# Patient Record
Sex: Female | Born: 1955 | Race: White | Hispanic: No | Marital: Married | Smoking: Never smoker
Health system: Southern US, Community
[De-identification: ages and names within clinical notes are randomized; demographics above are authoritative.]

## PROBLEM LIST (undated history)

## (undated) DIAGNOSIS — I10 Essential (primary) hypertension: Secondary | ICD-10-CM

## (undated) DIAGNOSIS — E119 Type 2 diabetes mellitus without complications: Secondary | ICD-10-CM

## (undated) DIAGNOSIS — I639 Cerebral infarction, unspecified: Secondary | ICD-10-CM

## (undated) HISTORY — PX: TONSILLECTOMY: SUR1361

---

## 2010-05-01 ENCOUNTER — Ambulatory Visit: Payer: Self-pay

## 2010-05-03 ENCOUNTER — Ambulatory Visit: Payer: Self-pay

## 2011-01-21 ENCOUNTER — Ambulatory Visit: Payer: Self-pay | Admitting: *Deleted

## 2011-01-21 ENCOUNTER — Inpatient Hospital Stay: Payer: Self-pay | Admitting: Internal Medicine

## 2011-02-17 ENCOUNTER — Encounter: Payer: Self-pay | Admitting: Family Medicine

## 2011-02-20 ENCOUNTER — Encounter: Payer: Self-pay | Admitting: Family Medicine

## 2011-03-18 ENCOUNTER — Ambulatory Visit: Payer: Self-pay | Admitting: Gastroenterology

## 2011-03-23 ENCOUNTER — Encounter: Payer: Self-pay | Admitting: Family Medicine

## 2011-04-22 ENCOUNTER — Encounter: Payer: Self-pay | Admitting: Family Medicine

## 2011-05-23 ENCOUNTER — Encounter: Payer: Self-pay | Admitting: Family Medicine

## 2011-05-27 ENCOUNTER — Other Ambulatory Visit: Payer: Self-pay

## 2011-06-22 ENCOUNTER — Encounter: Payer: Self-pay | Admitting: Family Medicine

## 2011-07-23 ENCOUNTER — Encounter: Payer: Self-pay | Admitting: Family Medicine

## 2011-08-23 ENCOUNTER — Encounter: Payer: Self-pay | Admitting: Family Medicine

## 2011-08-27 DIAGNOSIS — F09 Unspecified mental disorder due to known physiological condition: Secondary | ICD-10-CM

## 2011-08-27 DIAGNOSIS — I6340 Cerebral infarction due to embolism of unspecified cerebral artery: Secondary | ICD-10-CM

## 2011-09-04 DIAGNOSIS — I619 Nontraumatic intracerebral hemorrhage, unspecified: Secondary | ICD-10-CM

## 2011-09-04 DIAGNOSIS — F09 Unspecified mental disorder due to known physiological condition: Secondary | ICD-10-CM

## 2013-12-12 ENCOUNTER — Ambulatory Visit: Payer: Self-pay | Admitting: Family Medicine

## 2014-04-11 ENCOUNTER — Ambulatory Visit: Payer: Self-pay | Admitting: Gastroenterology

## 2014-04-11 LAB — PROTIME-INR
INR: 1
Prothrombin Time: 13.3 secs (ref 11.5–14.7)

## 2014-04-14 LAB — PATHOLOGY REPORT

## 2014-11-02 DIAGNOSIS — E119 Type 2 diabetes mellitus without complications: Secondary | ICD-10-CM

## 2014-12-13 ENCOUNTER — Ambulatory Visit: Payer: Self-pay | Admitting: Family Medicine

## 2015-01-01 ENCOUNTER — Ambulatory Visit: Payer: Self-pay | Admitting: Family Medicine

## 2015-05-17 ENCOUNTER — Other Ambulatory Visit: Payer: Self-pay | Admitting: Family Medicine

## 2015-05-17 DIAGNOSIS — R928 Other abnormal and inconclusive findings on diagnostic imaging of breast: Secondary | ICD-10-CM

## 2015-05-17 DIAGNOSIS — Z803 Family history of malignant neoplasm of breast: Secondary | ICD-10-CM

## 2015-07-09 ENCOUNTER — Ambulatory Visit
Admission: RE | Admit: 2015-07-09 | Discharge: 2015-07-09 | Disposition: A | Discharge status: Home / Self Care | Payer: PPO | Source: Ambulatory Visit | Attending: Family Medicine | Admitting: Family Medicine

## 2015-07-09 DIAGNOSIS — R928 Other abnormal and inconclusive findings on diagnostic imaging of breast: Secondary | ICD-10-CM

## 2015-07-09 DIAGNOSIS — Z803 Family history of malignant neoplasm of breast: Secondary | ICD-10-CM

## 2015-07-09 DIAGNOSIS — Z09 Encounter for follow-up examination after completed treatment for conditions other than malignant neoplasm: Secondary | ICD-10-CM | POA: Insufficient documentation

## 2015-07-11 ENCOUNTER — Ambulatory Visit: Payer: PPO | Attending: Neurology

## 2015-07-11 DIAGNOSIS — R29898 Other symptoms and signs involving the musculoskeletal system: Secondary | ICD-10-CM | POA: Diagnosis not present

## 2015-07-11 DIAGNOSIS — R2681 Unsteadiness on feet: Secondary | ICD-10-CM | POA: Insufficient documentation

## 2015-07-11 NOTE — Therapy (Deleted)
[place] [facility] MAIN [facility] SERVICES [address] [place], [place], [address] Phone: [phone]   Fax:  [phone]  Physical Therapy Treatment  Patient Details  Name: Sherri Jenkins MRN: [id number] Date of Birth: 08/09/1956 Referring Provider:  Morene Crocker, MD  Encounter Date: 07/11/2015      PT End of Session - 07/11/15 1225    Visit Number 1   Number of Visits 9   Date for PT Re-Evaluation 08/01/15   Authorization Type 1/10 G codes   PT Start Time 0920   PT Stop Time 0945   PT Time Calculation (min) 25 min   Equipment Utilized During Treatment Gait belt   Activity Tolerance Patient tolerated treatment well   Behavior During Therapy [facility] for tasks assessed/performed      History reviewed. No pertinent past medical history.  History reviewed. No pertinent past surgical history.  There were no vitals filed for this visit.  Visit Diagnosis:  Unsteadiness on feet  Weakness of both lower extremities      Subjective Assessment - 07/11/15 01-May-1207    Subjective pt reports she had a stroke in January 2012 and had regained most of her function prior to stroke.  pt relates that as of December of 2015 she has started experiencing increased fatigue, decreased strength, decreased balance and atributes it to her untrolled DM.  She was diagnosed with DM in December of  2015 and her MD  is working on helping control her glucose levels.  pt reports ~2 weeks ago she fell down when she was "sleep walking"/not fully awake and ended on the ground face down.  pt relates she did not sustain any injuries.  pt relates she uses her single point cane when she is in the community for safety and does not use it at home.  pt reports difficulty walking on uneven ground due to decreae in balance.  pt also relates she used to be able be to walk for ~ an hour and now can only walk ~ 20 minutes due to fatigue.     Patient Stated Goals increase speed/indurance/strength and  balance   Multiple Pain Sites No            [facility] PT Assessment - 07/11/15 0001    Assessment   Medical Diagnosis left hemiplegia   Onset Date/Surgical Date 01/05/11   Hand Dominance Right   Prior Therapy physical therapy   Precautions   Precautions Fall   Restrictions   Weight Bearing Restrictions No   Balance Screen   Has the patient fallen in the past 6 months Yes   How many times? 1   Has the patient had a decrease in activity level because of a fear of falling?  Yes   Is the patient reluctant to leave their home because of a fear of falling?  No   Home Environment   Living Environment Private residence   Living Arrangements Spouse/significant other   Available Help at Discharge Family   Type of Home House   Home Layout One level   Home Equipment [place] - single point;Grab bars - toilet;Tub bench   Prior Function   Level of Independence Independent with basic ADLs;Independent with household mobility without device;Independent with community mobility with device   Vocation On disability   Cognition   Overall Cognitive Status Within Functional Limits for tasks assessed   Memory Impaired  expresses difficulty   Memory Impairment --  expresses decreased retrieval  Awareness Appears intact   Sensation   Light Touch Appears Intact  decreased sensation on left side   Proprioception Not tested   Coordination   Gross Motor Movements are Fluid and Coordinated Yes   Finger Nose Finger Test intact   Posture/Postural Control   Posture/Postural Control No significant limitations      pt arrived ~30 minutes late  UE and LE dermatomes: intact but less on L LE/UE myotomes: WNL  LE MMT: Right hip flexion: 4/5 Left hip flexion: 4-/5 Right/left knee extension: 5/5 Right/left knee flexion: 5/5 Right/left ankle dorsiflexion: 4+/5 Right/left ankle eversion: 4+/5 Right left ankle inversion: 4+/5    Right/left knee jerK: 2 Ankle jerk: absent  5x  sit-to-stand:12.17 10 meter gait speed with single point cane: 1.85m/s 10 meter gait speed without cane: 1.39 m/s                         PT Education - 04-Aug-2015 1224    Education provided Yes   Education Details rational for outcome measures, plan of care   Person(s) Educated Patient   Methods Explanation   Comprehension Verbalized understanding             PT Long Term Goals - 04-Aug-2015 1502    PT LONG TERM GOAL #1   Title t will perfrom 5x sit-to-stand in less than or equal to 7.1 seconds in order to demonstrate average functional LE strength in her age group and decrease risk of falls   Baseline 12.17   Time 4   Period Weeks   Status New   PT LONG TERM GOAL #2   Title pt will report walking for ~ 30 minutes before resting in order to progress to prior level of fuctnion of 60 minutes   Baseline ambulates for ~20 min   Time 4   Period Weeks   Status New   PT LONG TERM GOAL #3   Title pt will ambulate with a gait speed of at least 1.20m/s on unveven ground in order to ambulate safely in the community    Baseline pt ambulates with gait speed of 1.39 on level ground    Period Weeks   Status New               Plan - 2015-08-04 1226    Clinical Impression Statement pt is a 59 year old female who had a CVA in January of 2012 whose balance, functional activity tolerance and strength have declined since december 2015.  pt presents with decreased functional LE strength, risk of falls, ampulates with decreased stance on left during gait and with minimal left arm swing.  pt would benefit from skilled PT servies to improve LE strength, decrease risk of falls, improve gait and functional activity tolerance to return to prior level of function    Pt will benefit from skilled therapeutic intervention in order to improve on the following deficits Decreased strength;Decreased balance;Decreased activity tolerance;Decreased endurance;Abnormal gait;Difficulty walking    Rehab Potential Good   PT Frequency 2x / week   PT Duration 4 weeks   PT Treatment/Interventions ADLs/Self Care Home Management;Aquatic Therapy;Functional mobility training;Patient/family education;Therapeutic activities;Therapeutic exercise;Balance training;Manual techniques;Neuromuscular re-education;Stair training;Gait training;Biofeedback;Electrical Stimulation   PT Next Visit Plan 6 min walk test, balance outcome measure          G-Codes - 04-Aug-2015 1514    Functional Assessment Tool Used outcome measures, history, clinical judgement   Functional Limitation Mobility: Walking and moving around  Mobility: Walking and Moving Around Current Status [phone](G8978) At least 1 percent but less than 20 percent impaired, limited or restricted  18%   Mobility: Walking and Moving Around Goal Status [phone](G8979) At least 1 percent but less than 20 percent impaired, limited or restricted  to less than 10%      Problem List There are no active problems to display for this patient.  Janus MolderJorge Theoren Palka, SPT Janus MolderJorge Vinton Layson 07/11/2015, 3:16 PM  [facility] MAIN [facility] [address] [place], [place], [address] Phone: [phone]   Fax:  [phone]

## 2015-07-11 NOTE — Therapy (Addendum)
[place] [facility] [address] [place], [place], [address] Phone: [phone]   Fax:  [phone]  Physical Therapy Evaluation  Patient Details  Name: Sherri Jenkins MRN: [id number] Date of Birth: 1956/08/14 Referring Provider:  Morene CrockerPotter, Zachary E, MD  Encounter Date: 07/11/2015      PT End of Session - 07/11/15 1225    Visit Number 1   Number of Visits 9   Date for PT Re-Evaluation 08/01/15   Authorization Type 1/10 G codes   PT Start Time 0920   PT Stop Time 0945   PT Time Calculation (min) 25 min   Equipment Utilized During Treatment Gait belt   Activity Tolerance Patient tolerated treatment well   Behavior [facility] Therapy [facility] for tasks assessed/performed      History reviewed. No pertinent past medical history.  History reviewed. No pertinent past surgical history.  There were no vitals filed for this visit.  Visit Diagnosis:  Unsteadiness on feet - Plan: PT plan of care cert/re-cert  Weakness of both lower extremities - Plan: PT plan of care cert/re-cert      Subjective Assessment - 07/11/15 1208    Subjective pt reports she had a stroke in January 2012 and had regained most of her function prior to stroke.  pt relates that as of December of 2015 she has started experiencing increased fatigue, decreased strength, decreased balance and atributes it to her untrolled DM.  She was diagnosed with DM in December of  2015 and her MD  is working on helping control her glucose levels.  pt reports ~2 weeks ago she fell down when she was "sleep walking"/not fully awake and ended on the ground face down.  pt relates she did not sustain any injuries.  pt relates she uses her single point cane when she is in the community for safety and does not use it at home.  pt reports difficulty walking on uneven ground due to decreae in balance.  pt also relates she used to be able be to walk for ~ an hour and now can only walk ~ 20 minutes due to  fatigue.     Patient Stated Goals increase speed/indurance/strength and balance   Multiple Pain Sites No            [facility] PT Assessment - 07/11/15 1200    Assessment   Medical Diagnosis left hemiplegia   Onset Date/Surgical Date 01/05/11   Hand Dominance Right   Prior Therapy physical therapy   Precautions   Precautions Fall   Restrictions   Weight Bearing Restrictions No   Home Environment   Living Environment Private residence   Living Arrangements Spouse/significant other   Available Help at Discharge Family   Type of Home House   Home Layout One level   Home Equipment [place] - single point;Grab bars - toilet;Tub bench   Prior Function   Level of Independence Independent with basic ADLs;Independent with household mobility without device;Independent with community mobility with device   Vocation On disability   Cognition   Overall Cognitive Status Within Functional Limits for tasks assessed   Memory Impaired  expresses difficulty   Memory Impairment --  expresses decreased retrieval    Awareness Appears intact   Sensation   Light Touch Appears Intact  decreased sensation on left side   Proprioception Not tested   Coordination   Gross Motor Movements are Fluid and Coordinated Yes   Finger Nose Finger Test intact  Posture/Postural Control   Posture/Postural Control No significant limitations   Ambulation/Gait   Gait velocity 1.69m/s      pt arrived ~30 minutes late  UE and LE dermatomes: intact but less on L LE/UE myotomes: WNL  LE MMT: Right hip flexion: 4/5 Left hip flexion: 4-/5 Right/left knee extension: 5/5 Right/left knee flexion: 5/5 Right/left ankle dorsiflexion: 4+/5 Right/left ankle eversion: 4+/5 Right left ankle inversion: 4+/5    Right/left knee jerK: 2 Ankle jerk: absent  5x sit-to-stand:12.17 10 meter gait speed with single point cane: 1.74m/s 10 meter gait speed without cane: 1.39 m/s  Posture:  Patient sits and ambulates with  left arm held close to her torso with elbow bent at 90 degrees Pt ambulates with decreased left arm swing, decreased stance on left and short stride length                 PT Education - 07/11/15 1224    Education provided Yes   Education Details rational for outcome measures, plan of care   Person(s) Educated Patient   Methods Explanation   Comprehension Verbalized understanding             PT Long Term Goals - 07/11/15 1502    PT LONG TERM GOAL #1   Title t will perfrom 5x sit-to-stand in less than or equal to 7.1 seconds in order to demonstrate average functional LE strength in her age group and decrease risk of falls   Baseline 12.17   Time 4   Period Weeks   Status New   PT LONG TERM GOAL #2   Title pt will report walking for ~ 30 minutes before resting in order to progress to prior level of fuctnion of 60 minutes   Baseline ambulates for ~20 min   Time 4   Period Weeks   Status New   PT LONG TERM GOAL #3   Title pt will ambulate with a gait speed of at least 1.63m/s on unveven ground in order to ambulate safely in the community    Baseline pt ambulates with gait speed of 1.39 on level ground    Period Weeks   Status New               Plan - 07/11/15 1226    Clinical Impression Statement pt is a 59 year old female who had a CVA in January of 2012 whose balance, functional activity tolerance and strength have declined since december 2015.  pt presents with decreased functional LE strength, risk of falls, ampulates with decreased stance on left during gait and with minimal left arm swing.  pt would benefit from skilled PT servies to improve LE strength, decrease risk of falls, improve gait and functional activity tolerance to return to prior level of function    Pt will benefit from skilled therapeutic intervention in order to improve on the following deficits Decreased strength;Decreased balance;Decreased activity tolerance;Decreased endurance;Abnormal  gait;Difficulty walking   Rehab Potential Good   PT Frequency 2x / week   PT Duration 4 weeks   PT Treatment/Interventions ADLs/Self Care Home Management;Aquatic Therapy;Functional mobility training;Patient/family education;Therapeutic activities;Therapeutic exercise;Balance training;Manual techniques;Neuromuscular re-education;Stair training;Gait training;Biofeedback;Electrical Stimulation   PT Next Visit Plan 6 min walk test, balance outcome measure          G-Codes - 2015-07-30 0948    Functional Assessment Tool Used outcome measures, history, clinical judgement   Functional Limitation Mobility: Walking and moving around   Mobility: Walking and Moving Around Current Status [phone])  At least 20 percent but less than 40 percent impaired, limited or restricted   Mobility: Walking and Moving Around Goal Status [phone]) At least 1 percent but less than 20 percent impaired, limited or restricted       Problem List There are no active problems to display for this patient.  Janus Molder, SPT This entire session was performed under direct supervision and direction of a licensed Estate agent . I have personally read, edited and approve of the note as written. Carlyon Shadow. Tortorici, PT, DPT [phone]  Tortorici,Ashley 07/12/2015, 9:55 AM  [place] [facility] MAIN [facility] SERVICES [address] [place], [place], [address] Phone: [phone]   Fax:  [phone]

## 2015-07-16 ENCOUNTER — Ambulatory Visit: Payer: PPO

## 2015-07-18 ENCOUNTER — Ambulatory Visit: Payer: PPO

## 2015-07-19 ENCOUNTER — Ambulatory Visit: Payer: PPO

## 2015-07-19 VITALS — BP 145/64

## 2015-07-19 DIAGNOSIS — R29898 Other symptoms and signs involving the musculoskeletal system: Secondary | ICD-10-CM

## 2015-07-19 DIAGNOSIS — R2681 Unsteadiness on feet: Secondary | ICD-10-CM

## 2015-07-19 NOTE — Therapy (Signed)
[place] [facility] MAIN [facility] SERVICES [address] [place], [place], [address] Phone: [phone]   Fax:  [phone]  Physical Therapy Treatment  Patient Details  Name: Sherri Jenkins MRN: [id number] Date of Birth: August 03, 1956 Referring Provider:  Morene Crocker, MD  Encounter Date: 07/19/2015      PT End of Session - 07/19/15 1733    Visit Number 2   Number of Visits 9   Date for PT Re-Evaluation 08/01/15   Authorization Type 2/10 G code   PT Start Time 1445   PT Stop Time 1530   PT Time Calculation (min) 45 min   Equipment Utilized During Treatment Gait belt   Activity Tolerance Patient tolerated treatment well   Behavior During Therapy [facility] for tasks assessed/performed      History reviewed. No pertinent past medical history.  History reviewed. No pertinent past surgical history.  Filed Vitals:   07/19/15 1728  BP: 145/64    Visit Diagnosis:  Unsteadiness on feet  Weakness of both lower extremities      Subjective Assessment - 07/19/15 1728    Subjective pt reports she has been walking to build up her endurance. reports no pain    Patient Stated Goals increase speed/indurance/strength and balance    Therex: DGI: 17/24 walk test: 1189ft BP assessed after walk   NMR:  NBOS: EO/EC 30 s x 3 Semi tandem vert/ horiz head turns x 5 - each leg Wide BOS with eyes closed and head turns 2x10 Pt required SBA for safety                              PT Education - 07/19/15 1731    Education provided Yes   Education Details balance education    Person(s) Educated Patient   Methods Explanation   Comprehension Verbalized understanding             PT Long Term Goals - 07/11/15 1502    PT LONG TERM GOAL #1   Title t will perfrom 5x sit-to-stand in less than or equal to 7.1 seconds in order to demonstrate average functional LE strength in her age group and decrease risk of falls   Baseline  12.17   Time 4   Period Weeks   Status New   PT LONG TERM GOAL #2   Title pt will report walking for ~ 30 minutes before resting in order to progress to prior level of fuctnion of 60 minutes   Baseline ambulates for ~20 min   Time 4   Period Weeks   Status New   PT LONG TERM GOAL #3   Title pt will ambulate with a gait speed of at least 1.29m/s on unveven ground in order to ambulate safely in the community    Baseline pt ambulates with gait speed of 1.39 on level ground    Period Weeks   Status New               Plan - 07/19/15 1733    Clinical Impression Statement pt did well on 6 min walk- and is almost within age norms walking 1115 ft today. pt did show moderate fall risk on DGI scoring 17/24. PT added balance exercises to HEP focusing on vestibular system. pt showed good understanding of this. Pt would benefit from continued skilled PT services to address dynamic balance and endurance to maximize community mobility.  Pt will benefit from skilled therapeutic intervention in order to improve on the following deficits Decreased strength;Decreased balance;Decreased activity tolerance;Decreased endurance;Abnormal gait;Difficulty walking   Rehab Potential Good   PT Frequency 2x / week   PT Duration 4 weeks   PT Treatment/Interventions ADLs/Self Care Home Management;Aquatic Therapy;Functional mobility training;Patient/family education;Therapeutic activities;Therapeutic exercise;Balance training;Manual techniques;Neuromuscular re-education;Stair training;Gait training;Biofeedback;Electrical Stimulation        Problem List There are no active problems to display for this patient.  Carlyon Shadow. Sheddrick Lattanzio, PT, DPT [phone]  Jamisen Duerson 07/19/2015, 5:37 PM  [place] [facility] MAIN [facility] SERVICES [address] [place], [place], [address] Phone: [phone]   Fax:  [phone]

## 2015-07-19 NOTE — Patient Instructions (Signed)
[email or web address] NBOS - eyes open / closed 30s x 3 Semi tandem with head turns vert/horiz 2x5

## 2015-07-23 ENCOUNTER — Ambulatory Visit: Payer: PPO | Attending: Neurology

## 2015-07-23 DIAGNOSIS — R29898 Other symptoms and signs involving the musculoskeletal system: Secondary | ICD-10-CM

## 2015-07-23 DIAGNOSIS — R2681 Unsteadiness on feet: Secondary | ICD-10-CM | POA: Insufficient documentation

## 2015-07-23 NOTE — Therapy (Signed)
[place] [facility] MAIN [facility] SERVICES [address] [place], [place], [address] Phone: [phone]   Fax:  [phone]  Physical Therapy Treatment  Patient Details  Name: Sherri Jenkins MRN: [id number] Date of Birth: 25-Oct-1956 Referring Provider:  Morene Crocker, MD  Encounter Date: 07/23/2015      PT End of Session - 07/23/15 1323    Visit Number 3   Number of Visits 9   Date for PT Re-Evaluation 08/01/15   Authorization Type 3/10 G code   PT Start Time 0919   PT Stop Time 1002   PT Time Calculation (min) 43 min   Equipment Utilized During Treatment Gait belt   Activity Tolerance Patient tolerated treatment well   Behavior During Therapy [facility] for tasks assessed/performed      History reviewed. No pertinent past medical history.  History reviewed. No pertinent past surgical history.  There were no vitals filed for this visit.  Visit Diagnosis:  Unsteadiness on feet  Weakness of both lower extremities      Subjective Assessment - 07/23/15 1320    Subjective pt reports she is doing well and left leg is slowly improveing. pt reports no pain    Patient Stated Goals increase speed/indurance/strength and balance   Currently in Pain? No/denies        Therex: Sit to stand x 10 Hip flexion/abduction/extension with red band x10 each leg  Step up/down on 4 inch step x10 each leg Side step up/down on 4 inch step x10 each leg Pt demonstrates decreased time on L LE during single leg stance phase of each exercise Pt required verbal cueing for proper technique  NMR:  Side stepping on airex matt x4 each way  Forward stepping on airex matt x4 each way Marching 4x65ft Forward walking with head turns 2x40 ft Forward walking with head turns and UE reach 2x40 ft Forward step and back x10 each LE  Side step and back x4 each LE Pt required verbal cueing for correct technique and focus on clearing L foot during activities.  Pt required SBA-min A  throughout session for safety (min A during one LOB) Pt required verbal cueing on increasing left arm sing during ambulation                            PT Education - 07/23/15 1322    Education provided Yes   Education Details improving left LE to improve single leg stance on L LE and confidence   Person(s) Educated Patient   Methods Explanation   Comprehension Verbalized understanding             PT Long Term Goals - 07/11/15 1502    PT LONG TERM GOAL #1   Title t will perfrom 5x sit-to-stand in less than or equal to 7.1 seconds in order to demonstrate average functional LE strength in her age group and decrease risk of falls   Baseline 12.17   Time 4   Period Weeks   Status New   PT LONG TERM GOAL #2   Title pt will report walking for ~ 30 minutes before resting in order to progress to prior level of fuctnion of 60 minutes   Baseline ambulates for ~20 min   Time 4   Period Weeks   Status New   PT LONG TERM GOAL #3   Title pt will ambulate with a gait speed of at least 1.32m/s on unveven  ground in order to ambulate safely in the community    Baseline pt ambulates with gait speed of 1.39 on level ground    Period Weeks   Status New               Plan - 07/23/15 1324    Clinical Impression Statement pt was able to complete session with 1 rest break and no increase in pain.  pt experienced 1 LOB while performing forward marching that required min A to recover.  pt demonstrates decreased stability during sinlge leg stance on left during session.  pt is able perform dual tasks while ambulating forward with no LOB. pt will benefit from skilled PT services to improve deficits    Pt will benefit from skilled therapeutic intervention in order to improve on the following deficits Decreased strength;Decreased balance;Decreased activity tolerance;Decreased endurance;Abnormal gait;Difficulty walking   Rehab Potential Good   PT Frequency 2x / week   PT  Duration 4 weeks   PT Treatment/Interventions ADLs/Self Care Home Management;Aquatic Therapy;Functional mobility training;Patient/family education;Therapeutic activities;Therapeutic exercise;Balance training;Manual techniques;Neuromuscular re-education;Stair training;Gait training;Biofeedback;Electrical Stimulation        Problem List There are no active problems to display for this patient.  Janus Molder, SPT This entire session was performed under direct supervision and direction of a licensed Estate agent . I have personally read, edited and approve of the note as written. Carlyon Shadow. Tortorici, PT, DPT [phone]  Tortorici,Ashley 07/23/2015, 2:17 PM  [place] [facility] MAIN [facility] SERVICES [address] [place], [place], [address] Phone: [phone]   Fax:  [phone]

## 2015-07-25 ENCOUNTER — Ambulatory Visit: Payer: PPO

## 2015-07-25 DIAGNOSIS — R2681 Unsteadiness on feet: Secondary | ICD-10-CM

## 2015-07-25 DIAGNOSIS — R29898 Other symptoms and signs involving the musculoskeletal system: Secondary | ICD-10-CM

## 2015-07-25 NOTE — Therapy (Signed)
[facility] [facility] MAIN [facility] SERVICES [address] [place], [place], [address] Phone: [phone]   Fax:  [phone]  Physical Therapy Treatment  Patient Details  Name: Sherri Jenkins MRN: [id number] Date of Birth: 11-17-1956 Referring Provider:  Morene Crocker, MD  Encounter Date: 07/25/2015      PT End of Session - 07/25/15 1805    Visit Number 4   Number of Visits 9   Date for PT Re-Evaluation 08/01/15   Authorization Type 4/10 G code   PT Start Time 0948   PT Stop Time 1015   PT Time Calculation (min) 27 min   Equipment Utilized During Treatment Gait belt   Activity Tolerance Patient tolerated treatment well   Behavior During Therapy [facility] for tasks assessed/performed      History reviewed. No pertinent past medical history.  History reviewed. No pertinent past surgical history.  There were no vitals filed for this visit.  Visit Diagnosis:  Unsteadiness on feet  Weakness of both lower extremities      Subjective Assessment - 07/25/15 1802    Subjective pt denies any pain this morning and reports her hips were sore after the last session.     Patient Stated Goals increase speed/indurance/strength and balance      Pt arrived 15 minutes late    NMR:  Side stepping on airex matt x4 laps Forward stepping on airex matt x4 laps Marching x 4 laps on //bars Obstacle course: side step over airex mat, weave between 3 cones followed by step up/down 4 inch step x 2 laps Obstacle course: side step over airex mat, diagonally move between 3 cones and then step up/down  Inch step x 2 laps, pt experienced greater difficulty with this versus above Stepping forward and back bilaterally with emphasis on engaging left arm x 4 min  Pt required verbal cueing for correct technique and focus on clearing L foot during activities. Pt required SBA for safety  Pt required verbal cueing on increasing left arm sing and or use during  activites                           PT Education - 07/25/15 1804    Education provided Yes   Education Details to focus on increasing left arm swing during gait to improve balance and normal gait.    Person(s) Educated Patient   Methods Explanation   Comprehension Verbalized understanding             PT Long Term Goals - 07/11/15 1502    PT LONG TERM GOAL #1   Title t will perfrom 5x sit-to-stand in less than or equal to 7.1 seconds in order to demonstrate average functional LE strength in her age group and decrease risk of falls   Baseline 12.17   Time 4   Period Weeks   Status New   PT LONG TERM GOAL #2   Title pt will report walking for ~ 30 minutes before resting in order to progress to prior level of fuctnion of 60 minutes   Baseline ambulates for ~20 min   Time 4   Period Weeks   Status New   PT LONG TERM GOAL #3   Title pt will ambulate with a gait speed of at least 1.19m/s on unveven ground in order to ambulate safely in the community    Baseline pt ambulates with gait speed of 1.39 on level ground  Period Weeks   Status New               Plan - 07/25/15 1806    Clinical Impression Statement pt was able to complete session without loss of balance.  pt demonstates little L UE arm swing or use during activites and requires verbal cueing to engage L UE.  Continues to tolerate PT sessions well and is highly motivated.    Pt will benefit from skilled therapeutic intervention in order to improve on the following deficits Decreased strength;Decreased balance;Decreased activity tolerance;Decreased endurance;Abnormal gait;Difficulty walking   Rehab Potential Good   PT Frequency 2x / week   PT Duration 4 weeks   PT Treatment/Interventions ADLs/Self Care Home Management;Aquatic Therapy;Functional mobility training;Patient/family education;Therapeutic activities;Therapeutic exercise;Balance training;Manual techniques;Neuromuscular  re-education;Stair training;Gait training;Biofeedback;Electrical Stimulation   PT Next Visit Plan 6 min walk test followed by neuro-ed activites        Problem List There are no active problems to display for this patient.  Janus Molder, SPT This entire session was performed under direct supervision and direction of a licensed Estate agent . I have personally read, edited and approve of the note as written.     Carlyon Shadow. Tortorici, PT, DPT  [phone]   Tortorici,Ashley 07/26/2015, 10:21 AM  [place] MAIN [facility] SERVICES [address] [place], [place], [address] Phone: [phone]   Fax:  [phone]

## 2015-07-30 ENCOUNTER — Ambulatory Visit: Payer: PPO

## 2015-07-30 DIAGNOSIS — R29898 Other symptoms and signs involving the musculoskeletal system: Secondary | ICD-10-CM

## 2015-07-30 DIAGNOSIS — R2681 Unsteadiness on feet: Secondary | ICD-10-CM | POA: Diagnosis not present

## 2015-07-30 NOTE — Therapy (Signed)
[place] [facility] MAIN [facility] SERVICES [address] [place], [place], [address] Phone: [phone]   Fax:  [phone]  Physical Therapy Treatment  Patient Details  Name: Sherri Jenkins MRN: [id number] Date of Birth: [birth year] Referring Provider:  Morene Crocker, MD  Encounter Date: 07/30/2015      PT End of Session - 07/30/15 1849    Visit Number 5   Number of Visits 9   Date for PT Re-Evaluation 08/01/15   Authorization Type 5/10 G code   PT Start Time 1441   PT Stop Time 1515   PT Time Calculation (min) 34 min   Equipment Utilized During Treatment Gait belt   Activity Tolerance Patient tolerated treatment well   Behavior During Therapy [facility] for tasks assessed/performed      History reviewed. No pertinent past medical history.  History reviewed. No pertinent past surgical history.  There were no vitals filed for this visit.  Visit Diagnosis:  Unsteadiness on feet  Weakness of both lower extremities      Subjective Assessment - 07/30/15 1848    Subjective pt relates she does not feel great and that her blood sugar has been running slow from this morning, around 80 and it was also low on Saturday but otherwise is doing well    Patient Stated Goals increase speed/indurance/strength and balance   Currently in Pain? No/denies   Multiple Pain Sites No         Pt arrived 10 minutes late   Therex:  Nustep: x3 min (no charge) Bilateral hip flexion/abduction/extension with blue band x10 each  NMR:  Forward stepping on airex mat x4 laps Stepping forward and back bilaterally with emphasis on engaging left arm 2x10 Side step and back 2x10 each LE Sit to stand with R LE in front x10, pt demonstrated decreased stability and required SBA for safety Pt ambulated 100 ft while performing dual tasking (looking up, down, left, right, turning left, turning right and stopping)   Pt required verbal cueing for correct technique and focus on  clearing L foot during activities. Pt required SBA for safety  Pt required verbal cueing on increasing left arm sing and or use during activites  blood glucose measured by nursing (heart track): 140 mmol/L - prior to exercise (was within treatable limits)                            PT Long Term Goals - 07/11/15 1502    PT LONG TERM GOAL #1   Title t will perfrom 5x sit-to-stand in less than or equal to 7.1 seconds in order to demonstrate average functional LE strength in her age group and decrease risk of falls   Baseline 12.17   Time 4   Period Weeks   Status New   PT LONG TERM GOAL #2   Title pt will report walking for ~ 30 minutes before resting in order to progress to prior level of fuctnion of 60 minutes   Baseline ambulates for ~20 min   Time 4   Period Weeks   Status New   PT LONG TERM GOAL #3   Title pt will ambulate with a gait speed of at least 1.48m/s on unveven ground in order to ambulate safely in the community    Baseline pt ambulates with gait speed of 1.39 on level ground    Period Weeks   Status New  Plan - 07/30/15 1850    Clinical Impression Statement pt's blood glucose level was measured due to running low prior to lunch (80) and was 140 during session.  progressed neuro re-ed exercises today and pt was able to complete them no complain of pain nor did she require any rest breaks.     Pt will benefit from skilled therapeutic intervention in order to improve on the following deficits Decreased strength;Decreased balance;Decreased activity tolerance;Decreased endurance;Abnormal gait;Difficulty walking   Rehab Potential Good   PT Frequency 2x / week   PT Duration 4 weeks   PT Treatment/Interventions ADLs/Self Care Home Management;Aquatic Therapy;Functional mobility training;Patient/family education;Therapeutic activities;Therapeutic exercise;Balance training;Manual techniques;Neuromuscular re-education;Stair training;Gait  training;Biofeedback;Electrical Stimulation   PT Next Visit Plan 6 min walk test followed by neuro-ed activites        Problem List There are no active problems to display for this patient.  Donald Pore Makari Portman,SPT This entire session was performed under direct supervision and direction of a licensed Estate agent . I have personally read, edited and approve of the note as written.  Carlyon Shadow. Tortorici, PT, DPT [phone]  Tortorici,Ashley 07/31/2015, 8:21 AM  [place] [facility] MAIN [facility] SERVICES [address] [place], [place], [address] Phone: [phone]   Fax:  [phone]

## 2015-08-01 ENCOUNTER — Ambulatory Visit: Payer: PPO

## 2015-08-01 DIAGNOSIS — R2681 Unsteadiness on feet: Secondary | ICD-10-CM

## 2015-08-01 DIAGNOSIS — R29898 Other symptoms and signs involving the musculoskeletal system: Secondary | ICD-10-CM

## 2015-08-01 NOTE — Therapy (Signed)
[place] [facility] MAIN [facility] SERVICES [address] [place], [place], [address] Phone: [phone]   Fax:  [phone]  Physical Therapy Treatment  Patient Details  Name: BEADIE MATSUNAGA MRN: [id number] Date of Birth: Feb 11, 1956 Referring Provider:  Morene Crocker, MD  Encounter Date: 08/01/2015      PT End of Session - 08/01/15 1841    Visit Number 6   Number of Visits 9   Date for PT Re-Evaluation 08/08/15   Authorization Type 6/10 G code   PT Start Time 1431   PT Stop Time 1515   PT Time Calculation (min) 44 min   Equipment Utilized During Treatment Gait belt   Activity Tolerance Patient tolerated treatment well   Behavior During Therapy [facility] for tasks assessed/performed      History reviewed. No pertinent past medical history.  History reviewed. No pertinent past surgical history.  There were no vitals filed for this visit.  Visit Diagnosis:  Unsteadiness on feet  Weakness of both lower extremities      Subjective Assessment - 08/01/15 1436    Subjective pt reports she feels better today compared to her last session and relates her blood sugar is doing well today.  pt relates she continues to use cane outside her home for safety.     Patient Stated Goals increase speed/indurance/strength and balance   Currently in Pain? No/denies      thera act: Forward stepping on red mat x4 laps Side stepping on red mat x 4 laps Stepping forward and back bilaterally with emphasis on engaging left arm 2x10 Side step and back 2x10 each LE Stepping forward and back bilaterally on red mat x10  Side step and back on red mat x10 each  Backwards walking on red matt x 4 laps Pt ambulated 100 ft while performing dual tasking (looking up, down, left, right, turning left, turning right and stopping)  Pt required verbal cueing for correct technique and focus on clearing L foot during activities. Pt required SBA for safety  Pt required verbal cueing  on increasing left arm sing and or use during activites  Therex: Recumbent bike x 3 min no charge Sit to stand x10 Leg press with 130# 2x10 Side lung on red mat x 10 each LE                                                PT Long Term Goals - 08/01/15 1848    PT LONG TERM GOAL #1   Title t will perfrom 5x sit-to-stand in less than or equal to 7.1 seconds in order to demonstrate average functional LE strength in her age group and decrease risk of falls   Baseline 12.17   Time 2   Period Weeks   Status On-going   PT LONG TERM GOAL #2   Title pt will report walking for ~ 30 minutes before resting in order to progress to prior level of fuctnion of 60 minutes   Baseline ambulates for ~20 min   Time 2   Status On-going   PT LONG TERM GOAL #3   Title pt will ambulate with a gait speed of at least 1.2m/s on unveven ground in order to ambulate safely in the community    Baseline pt ambulates with gait speed of 1.39 on level ground  Time 2   Status On-going               Plan - 08-30-15 1846    Clinical Impression Statement pt is progressing well towards goals and was able to complete session and demonstrates improved functional activity tolerance by not requring rest breaks.  pt is ambulating with improved left foot clearnce.  pt would benefit from continued skilled PT servies to make further gains, decrease risk of falls.     Pt will benefit from skilled therapeutic intervention in order to improve on the following deficits Decreased strength;Decreased balance;Decreased activity tolerance;Decreased endurance;Abnormal gait;Difficulty walking   Rehab Potential Good   PT Frequency 2x / week   PT Duration 4 weeks   PT Treatment/Interventions ADLs/Self Care Home Management;Aquatic Therapy;Functional mobility training;Patient/family education;Therapeutic activities;Therapeutic exercise;Balance training;Manual techniques;Neuromuscular  re-education;Stair training;Gait training;Biofeedback;Electrical Stimulation   PT Next Visit Plan outcome measures           G-Codes - 30-Aug-2015 1852    Functional Assessment Tool Used --   Functional Limitation --   Mobility: Walking and Moving Around Current Status (Z6109) --   Mobility: Walking and Moving Around Goal Status (U0454) --      Problem List There are no active problems to display for this patient.  Janus Molder, SPT This entire session was performed under direct supervision and direction of a licensed Estate agent . I have personally read, edited and approve of the note as written. Carlyon Shadow. Tortorici, PT, DPT [phone]  Tortorici,Ashley 08/02/2015, 1:10 PM  [place] [facility] MAIN [facility] SERVICES [address] [place], [place], [address] Phone: [phone]   Fax:  [phone]

## 2015-08-06 ENCOUNTER — Ambulatory Visit: Payer: PPO

## 2015-08-06 DIAGNOSIS — R29898 Other symptoms and signs involving the musculoskeletal system: Secondary | ICD-10-CM

## 2015-08-06 DIAGNOSIS — R2681 Unsteadiness on feet: Secondary | ICD-10-CM | POA: Diagnosis not present

## 2015-08-06 NOTE — Therapy (Addendum)
[place] [facility] MAIN [facility] SERVICES [address] [place], [place], [address] Phone: [phone]   Fax:  [phone]  Physical Therapy Treatment  Patient Details  Name: Sherri Jenkins MRN: [id number] Date of Birth: 02-Feb-1956 Referring Provider:  Morene Crocker, MD  Encounter Date: 08/06/2015      PT End of Session - 08/06/15 1438    Visit Number 7   Number of Visits 9   Date for PT Re-Evaluation 08/08/15   Authorization Type 7/10 G code   PT Start Time 1431   PT Stop Time 1515   PT Time Calculation (min) 44 min   Equipment Utilized During Treatment Gait belt   Activity Tolerance Patient tolerated treatment well   Behavior During Therapy [facility] for tasks assessed/performed      History reviewed. No pertinent past medical history.  History reviewed. No pertinent past surgical history.  There were no vitals filed for this visit.  Visit Diagnosis:  Unsteadiness on feet  Weakness of both lower extremities      Subjective Assessment - 08/06/15 1436    Subjective pt reports she had a good weekend and is happy because she lost 1#.  pt relates her blood sugar was "behaving" this weekend.  pt reports she feels "okay" currently.     Patient Stated Goals increase speed/indurance/strength and balance   Currently in Pain? No/denies   Multiple Pain Sites No        NMR  Forward stepping on red mat with objects underneath x2 laps Side stepping on red mat with objects underneathx 2 laps Stepping forward and back bilaterally with emphasis on engaging left arm 1x10 Side step and back 1x10 each LE Tapping on forward, cross midline and and side step x10 bilaterally  Backwards walking on red matt with objects underneath x 2 laps Forward stepping on red mat with objects underneath x2 laps while holding yellow ball  Side stepping on red mat with objects underneathx 2 lap while holding yellow ball Backwards walking on red matt with objects underneath x2  laps while holding yellow ball Pt ambulated 100 ft while performing dual tasking (looking up, down, left, right, turning left, turning right and stopping)  Pt required verbal cueing for correct technique and focus on clearing L foot during activities. Pt required SBA for safety  Pt required verbal cueing on increasing left arm sing and or use during activites  Therex: Recumbent bike x 4 min level 6 no charge Sit to stand 2x10 Leg press with 130# 3x10                          PT Education - 08/06/15 1528    Education provided Yes   Education Details plan of care and will reassess outcome measures next visit   Person(s) Educated Patient   Methods Explanation   Comprehension Verbalized understanding             PT Long Term Goals - 08/01/15 1848    PT LONG TERM GOAL #1   Title t will perfrom 5x sit-to-stand in less than or equal to 7.1 seconds in order to demonstrate average functional LE strength in her age group and decrease risk of falls   Baseline 12.17   Time 2   Period Weeks   Status On-going   PT LONG TERM GOAL #2   Title pt will report walking for ~ 30 minutes before resting in order to progress to  prior level of fuctnion of 60 minutes   Baseline ambulates for ~20 min   Time 2   Status On-going   PT LONG TERM GOAL #3   Title pt will ambulate with a gait speed of at least 1.75m/s on unveven ground in order to ambulate safely in the community    Baseline pt ambulates with gait speed of 1.39 on level ground    Time 2   Status On-going               Plan - 08/06/15 1529    Clinical Impression Statement pt tolerated progression of thera activites well and doesnt require assistance to recover from loss of balance.  pt is ambulating with improved L foot clearance and improved functional activity tolerance due to mimial rest breaks between exercises.     Pt will benefit from skilled therapeutic intervention in order to improve on the  following deficits Decreased strength;Decreased balance;Decreased activity tolerance;Decreased endurance;Abnormal gait;Difficulty walking   Rehab Potential Good   PT Frequency 2x / week   PT Duration 4 weeks   PT Treatment/Interventions ADLs/Self Care Home Management;Aquatic Therapy;Functional mobility training;Patient/family education;Therapeutic activities;Therapeutic exercise;Balance training;Manual techniques;Neuromuscular re-education;Stair training;Gait training;Biofeedback;Electrical Stimulation   PT Next Visit Plan outcome measures         Problem List There are no active problems to display for this patient. Donald Pore Emanuele Mcwhirter,SPT  This entire session was performed under direct supervision and direction of a licensed therapist/therapist assistant . I have personally read, edited and approve of the note as written.  Carlyon Shadow. Tortorici, PT, DPT [phone]  Tortorici,Ashley 08/06/2015, 5:23 PM  [place] [facility] MAIN [facility] SERVICES [address] [place], [place], [address] Phone: [phone]   Fax:  [phone]

## 2015-08-08 ENCOUNTER — Ambulatory Visit: Payer: PPO

## 2015-08-08 DIAGNOSIS — R2681 Unsteadiness on feet: Secondary | ICD-10-CM

## 2015-08-08 DIAGNOSIS — R29898 Other symptoms and signs involving the musculoskeletal system: Secondary | ICD-10-CM

## 2015-08-09 NOTE — Therapy (Signed)
[place] MAIN [facility] SERVICES [address] [place], [place], [address] Phone: [phone]   Fax:  [phone]  Physical Therapy Treatment/Discharge Note 7/20-8/17/2016  Patient Details  Name: ROSIE TORREZ MRN: [id number] Date of Birth: [birth year] Referring Provider:  Anabel Bene, MD  Encounter Date: 08/08/2015      PT End of Session - 08/09/15 1017    Visit Number 8   Number of Visits 9   Date for PT Re-Evaluation 08/08/15   Authorization Type 8/10 G code   PT Start Time 1430   PT Stop Time 1515   PT Time Calculation (min) 45 min   Equipment Utilized During Treatment Gait belt   Activity Tolerance Patient tolerated treatment well   Behavior During Therapy [facility] for tasks assessed/performed      History reviewed. No pertinent past medical history.  History reviewed. No pertinent past surgical history.  There were no vitals filed for this visit.  Visit Diagnosis:  Unsteadiness on feet  Weakness of both lower extremities      Subjective Assessment - 08/08/15 1434    Subjective pt relates she is doing well and has not noticed any "real problems."  Pt relates her blood sugar has been controlled these past few days.  pt denies any falls or near falls.  pt reports since the start of PT she has gotten faster, improved her endurance and improved her balance.  pt denies any difficulty with functional activites.  pt denies any pain currently.     Patient Stated Goals increase speed/indurance/strength and balance   Currently in Pain? No/denies   Multiple Pain Sites No      SPT assessed outcome measures and progress towards goals:   5x sit to stand: 9.47 seconds 6 min walk test: 1240 ft DGI: 22/24 10 meter gait speed: 1.73 m/s fast 10 meter gait speed: 1.14 m/s normal speed                         PT Education - 08/09/15 1017    Education provided Yes   Education Details plan of care and outcome measure  results    Person(s) Educated Patient   Methods Explanation   Comprehension Verbalized understanding             PT Long Term Goals - 08/09/15 1018    PT LONG TERM GOAL #1   Title pt will perfrom 5x sit-to-stand in less than or equal to 7.1 seconds in order to demonstrate average functional LE strength in her age group and decrease risk of falls   Baseline 9.47   Time 4   Period Weeks   Status Partially Met   PT LONG TERM GOAL #2   Title pt will report walking for ~ 30 minutes before resting in order to progress to prior level of fuctnion of 60 minutes   Baseline ambulates for ~30 min   Time 4   Period Weeks   Status Achieved   PT LONG TERM GOAL #3   Title pt will ambulate with a gait speed of at least 1.97ms on unveven ground in order to ambulate safely in the community    Baseline pt ambulates with max gait speed of 1.73 m/s on level ground and normal gait speed of 1.14 m/s   Time 4   Status Achieved               Plan - 08/09/15 1124  Clinical Impression Statement pt has progressed well with PT and made significant improvents and now demonstrates low risk of falls, improved LE functional strength and improved activity tolerance.  due to patient meeting most of her goals and reporting her current level is almost to prior level function, pt will be discharged at with HEP and referred to [employer] to maintain and make further gains.     Pt will benefit from skilled therapeutic intervention in order to improve on the following deficits Decreased strength;Decreased balance;Decreased activity tolerance;Decreased endurance;Abnormal gait;Difficulty walking   Rehab Potential Good   PT Treatment/Interventions ADLs/Self Care Home Management;Aquatic Therapy;Functional mobility training;Patient/family education;Therapeutic activities;Therapeutic exercise;Balance training;Manual techniques;Neuromuscular re-education;Stair training;Gait training;Biofeedback;Electrical  Stimulation          G-Codes - Aug 31, 2015 1020    Functional Assessment Tool Used history, clinical judgment, outcome measures   Functional Limitation Mobility: Walking and moving around   Mobility: Walking and Moving Around Current Status [phone]) --   Mobility: Walking and Moving Around Goal Status [phone]) At least 1 percent but less than 20 percent impaired, limited or restricted   Mobility: Walking and Moving Around Discharge Status [phone]) At least 1 percent but less than 20 percent impaired, limited or restricted      Problem List There are no active problems to display for this patient.  Renford Dills, SPT This entire session was performed under direct supervision and direction of a licensed Chiropractor . I have personally read, edited and approve of the note as written. Gorden Harms. Tortorici, PT, DPT [phone]  Tortorici,Ashley 2015-08-31, 4:23 PM  [place] MAIN [facility] SERVICES [address] [place], [place], [address] Phone: [phone]   Fax:  [phone]

## 2015-11-01 ENCOUNTER — Other Ambulatory Visit: Payer: Self-pay | Admitting: Family Medicine

## 2015-11-01 DIAGNOSIS — Z1231 Encounter for screening mammogram for malignant neoplasm of breast: Secondary | ICD-10-CM

## 2015-11-19 ENCOUNTER — Other Ambulatory Visit: Payer: Self-pay | Admitting: Family Medicine

## 2015-11-19 DIAGNOSIS — R928 Other abnormal and inconclusive findings on diagnostic imaging of breast: Secondary | ICD-10-CM

## 2015-11-19 DIAGNOSIS — Z803 Family history of malignant neoplasm of breast: Secondary | ICD-10-CM

## 2016-01-01 DIAGNOSIS — I69359 Hemiplegia and hemiparesis following cerebral infarction affecting unspecified side: Secondary | ICD-10-CM | POA: Diagnosis not present

## 2016-01-01 DIAGNOSIS — Z7901 Long term (current) use of anticoagulants: Secondary | ICD-10-CM | POA: Diagnosis not present

## 2016-01-11 ENCOUNTER — Ambulatory Visit
Admission: RE | Admit: 2016-01-11 | Discharge: 2016-01-11 | Disposition: A | Discharge status: Home / Self Care | Payer: PPO | Source: Ambulatory Visit | Attending: Family Medicine | Admitting: Family Medicine

## 2016-01-11 DIAGNOSIS — R928 Other abnormal and inconclusive findings on diagnostic imaging of breast: Secondary | ICD-10-CM

## 2016-01-11 DIAGNOSIS — Z803 Family history of malignant neoplasm of breast: Secondary | ICD-10-CM

## 2016-01-11 DIAGNOSIS — N63 Unspecified lump in breast: Secondary | ICD-10-CM | POA: Diagnosis not present

## 2016-01-16 DIAGNOSIS — Z7901 Long term (current) use of anticoagulants: Secondary | ICD-10-CM | POA: Diagnosis not present

## 2016-01-16 DIAGNOSIS — I69359 Hemiplegia and hemiparesis following cerebral infarction affecting unspecified side: Secondary | ICD-10-CM | POA: Diagnosis not present

## 2016-01-30 DIAGNOSIS — I69359 Hemiplegia and hemiparesis following cerebral infarction affecting unspecified side: Secondary | ICD-10-CM | POA: Diagnosis not present

## 2016-01-30 DIAGNOSIS — Z7901 Long term (current) use of anticoagulants: Secondary | ICD-10-CM | POA: Diagnosis not present

## 2016-02-13 DIAGNOSIS — I69359 Hemiplegia and hemiparesis following cerebral infarction affecting unspecified side: Secondary | ICD-10-CM | POA: Diagnosis not present

## 2016-02-13 DIAGNOSIS — Z7901 Long term (current) use of anticoagulants: Secondary | ICD-10-CM | POA: Diagnosis not present

## 2016-02-27 DIAGNOSIS — Z7901 Long term (current) use of anticoagulants: Secondary | ICD-10-CM | POA: Diagnosis not present

## 2016-02-27 DIAGNOSIS — I69359 Hemiplegia and hemiparesis following cerebral infarction affecting unspecified side: Secondary | ICD-10-CM | POA: Diagnosis not present

## 2016-03-10 DIAGNOSIS — Z7901 Long term (current) use of anticoagulants: Secondary | ICD-10-CM | POA: Diagnosis not present

## 2016-03-10 DIAGNOSIS — I69359 Hemiplegia and hemiparesis following cerebral infarction affecting unspecified side: Secondary | ICD-10-CM | POA: Diagnosis not present

## 2016-03-24 DIAGNOSIS — I69359 Hemiplegia and hemiparesis following cerebral infarction affecting unspecified side: Secondary | ICD-10-CM | POA: Diagnosis not present

## 2016-03-24 DIAGNOSIS — Z7901 Long term (current) use of anticoagulants: Secondary | ICD-10-CM | POA: Diagnosis not present

## 2016-04-21 DIAGNOSIS — I69359 Hemiplegia and hemiparesis following cerebral infarction affecting unspecified side: Secondary | ICD-10-CM | POA: Diagnosis not present

## 2016-04-21 DIAGNOSIS — Z7901 Long term (current) use of anticoagulants: Secondary | ICD-10-CM | POA: Diagnosis not present

## 2016-05-05 DIAGNOSIS — I69359 Hemiplegia and hemiparesis following cerebral infarction affecting unspecified side: Secondary | ICD-10-CM | POA: Diagnosis not present

## 2016-05-05 DIAGNOSIS — Z7901 Long term (current) use of anticoagulants: Secondary | ICD-10-CM | POA: Diagnosis not present

## 2016-05-12 DIAGNOSIS — E663 Overweight: Secondary | ICD-10-CM | POA: Diagnosis not present

## 2016-05-12 DIAGNOSIS — E119 Type 2 diabetes mellitus without complications: Secondary | ICD-10-CM | POA: Diagnosis not present

## 2016-05-12 DIAGNOSIS — E782 Mixed hyperlipidemia: Secondary | ICD-10-CM | POA: Diagnosis not present

## 2016-05-12 DIAGNOSIS — R413 Other amnesia: Secondary | ICD-10-CM | POA: Diagnosis not present

## 2016-05-12 DIAGNOSIS — F330 Major depressive disorder, recurrent, mild: Secondary | ICD-10-CM | POA: Diagnosis not present

## 2016-05-12 DIAGNOSIS — Z7901 Long term (current) use of anticoagulants: Secondary | ICD-10-CM | POA: Diagnosis not present

## 2016-05-12 DIAGNOSIS — I69359 Hemiplegia and hemiparesis following cerebral infarction affecting unspecified side: Secondary | ICD-10-CM | POA: Diagnosis not present

## 2016-05-12 DIAGNOSIS — I10 Essential (primary) hypertension: Secondary | ICD-10-CM | POA: Diagnosis not present

## 2016-05-14 DIAGNOSIS — E210 Primary hyperparathyroidism: Secondary | ICD-10-CM | POA: Insufficient documentation

## 2016-05-26 DIAGNOSIS — I69359 Hemiplegia and hemiparesis following cerebral infarction affecting unspecified side: Secondary | ICD-10-CM | POA: Diagnosis not present

## 2016-05-26 DIAGNOSIS — Z7901 Long term (current) use of anticoagulants: Secondary | ICD-10-CM | POA: Diagnosis not present

## 2016-06-25 DIAGNOSIS — Z7901 Long term (current) use of anticoagulants: Secondary | ICD-10-CM | POA: Diagnosis not present

## 2016-06-25 DIAGNOSIS — I69359 Hemiplegia and hemiparesis following cerebral infarction affecting unspecified side: Secondary | ICD-10-CM | POA: Diagnosis not present

## 2016-07-23 DIAGNOSIS — Z7901 Long term (current) use of anticoagulants: Secondary | ICD-10-CM | POA: Diagnosis not present

## 2016-07-23 DIAGNOSIS — I69359 Hemiplegia and hemiparesis following cerebral infarction affecting unspecified side: Secondary | ICD-10-CM | POA: Diagnosis not present

## 2016-08-06 DIAGNOSIS — Z7901 Long term (current) use of anticoagulants: Secondary | ICD-10-CM | POA: Diagnosis not present

## 2016-08-06 DIAGNOSIS — I69359 Hemiplegia and hemiparesis following cerebral infarction affecting unspecified side: Secondary | ICD-10-CM | POA: Diagnosis not present

## 2016-08-20 DIAGNOSIS — Z7901 Long term (current) use of anticoagulants: Secondary | ICD-10-CM | POA: Diagnosis not present

## 2016-08-20 DIAGNOSIS — I69359 Hemiplegia and hemiparesis following cerebral infarction affecting unspecified side: Secondary | ICD-10-CM | POA: Diagnosis not present

## 2016-09-17 DIAGNOSIS — Z23 Encounter for immunization: Secondary | ICD-10-CM | POA: Diagnosis not present

## 2016-09-17 DIAGNOSIS — Z7901 Long term (current) use of anticoagulants: Secondary | ICD-10-CM | POA: Diagnosis not present

## 2016-10-06 DIAGNOSIS — I69359 Hemiplegia and hemiparesis following cerebral infarction affecting unspecified side: Secondary | ICD-10-CM | POA: Diagnosis not present

## 2016-10-06 DIAGNOSIS — Z7901 Long term (current) use of anticoagulants: Secondary | ICD-10-CM | POA: Diagnosis not present

## 2016-10-27 DIAGNOSIS — Z7901 Long term (current) use of anticoagulants: Secondary | ICD-10-CM | POA: Diagnosis not present

## 2016-10-27 DIAGNOSIS — I69359 Hemiplegia and hemiparesis following cerebral infarction affecting unspecified side: Secondary | ICD-10-CM | POA: Diagnosis not present

## 2016-11-12 DIAGNOSIS — I69359 Hemiplegia and hemiparesis following cerebral infarction affecting unspecified side: Secondary | ICD-10-CM | POA: Diagnosis not present

## 2016-11-12 DIAGNOSIS — Z7901 Long term (current) use of anticoagulants: Secondary | ICD-10-CM | POA: Diagnosis not present

## 2016-11-27 DIAGNOSIS — R05 Cough: Secondary | ICD-10-CM | POA: Diagnosis not present

## 2016-11-27 DIAGNOSIS — J019 Acute sinusitis, unspecified: Secondary | ICD-10-CM | POA: Diagnosis not present

## 2016-11-27 DIAGNOSIS — B9689 Other specified bacterial agents as the cause of diseases classified elsewhere: Secondary | ICD-10-CM | POA: Diagnosis not present

## 2016-12-10 DIAGNOSIS — I10 Essential (primary) hypertension: Secondary | ICD-10-CM | POA: Diagnosis not present

## 2016-12-10 DIAGNOSIS — R809 Proteinuria, unspecified: Secondary | ICD-10-CM | POA: Diagnosis not present

## 2016-12-10 DIAGNOSIS — Z1231 Encounter for screening mammogram for malignant neoplasm of breast: Secondary | ICD-10-CM | POA: Diagnosis not present

## 2016-12-10 DIAGNOSIS — E782 Mixed hyperlipidemia: Secondary | ICD-10-CM | POA: Diagnosis not present

## 2016-12-10 DIAGNOSIS — E210 Primary hyperparathyroidism: Secondary | ICD-10-CM | POA: Diagnosis not present

## 2016-12-10 DIAGNOSIS — E663 Overweight: Secondary | ICD-10-CM | POA: Diagnosis not present

## 2016-12-10 DIAGNOSIS — I69359 Hemiplegia and hemiparesis following cerebral infarction affecting unspecified side: Secondary | ICD-10-CM | POA: Diagnosis not present

## 2016-12-10 DIAGNOSIS — Z0000 Encounter for general adult medical examination without abnormal findings: Secondary | ICD-10-CM | POA: Diagnosis not present

## 2016-12-10 DIAGNOSIS — E119 Type 2 diabetes mellitus without complications: Secondary | ICD-10-CM | POA: Diagnosis not present

## 2016-12-10 DIAGNOSIS — R413 Other amnesia: Secondary | ICD-10-CM | POA: Diagnosis not present

## 2016-12-10 DIAGNOSIS — F330 Major depressive disorder, recurrent, mild: Secondary | ICD-10-CM | POA: Diagnosis not present

## 2016-12-10 DIAGNOSIS — Z7901 Long term (current) use of anticoagulants: Secondary | ICD-10-CM | POA: Diagnosis not present

## 2016-12-17 ENCOUNTER — Other Ambulatory Visit: Payer: Self-pay | Admitting: Family Medicine

## 2016-12-17 DIAGNOSIS — Z1239 Encounter for other screening for malignant neoplasm of breast: Secondary | ICD-10-CM

## 2016-12-19 DIAGNOSIS — Z7901 Long term (current) use of anticoagulants: Secondary | ICD-10-CM | POA: Diagnosis not present

## 2016-12-19 DIAGNOSIS — I69359 Hemiplegia and hemiparesis following cerebral infarction affecting unspecified side: Secondary | ICD-10-CM | POA: Diagnosis not present

## 2016-12-31 DIAGNOSIS — J40 Bronchitis, not specified as acute or chronic: Secondary | ICD-10-CM | POA: Diagnosis not present

## 2016-12-31 DIAGNOSIS — E119 Type 2 diabetes mellitus without complications: Secondary | ICD-10-CM | POA: Diagnosis not present

## 2016-12-31 DIAGNOSIS — I69359 Hemiplegia and hemiparesis following cerebral infarction affecting unspecified side: Secondary | ICD-10-CM | POA: Diagnosis not present

## 2017-01-02 DIAGNOSIS — Z7901 Long term (current) use of anticoagulants: Secondary | ICD-10-CM | POA: Diagnosis not present

## 2017-01-02 DIAGNOSIS — I69359 Hemiplegia and hemiparesis following cerebral infarction affecting unspecified side: Secondary | ICD-10-CM | POA: Diagnosis not present

## 2017-01-13 DIAGNOSIS — Z7901 Long term (current) use of anticoagulants: Secondary | ICD-10-CM | POA: Diagnosis not present

## 2017-01-13 DIAGNOSIS — I69359 Hemiplegia and hemiparesis following cerebral infarction affecting unspecified side: Secondary | ICD-10-CM | POA: Diagnosis not present

## 2017-02-02 DIAGNOSIS — Z7901 Long term (current) use of anticoagulants: Secondary | ICD-10-CM | POA: Diagnosis not present

## 2017-02-02 DIAGNOSIS — I69359 Hemiplegia and hemiparesis following cerebral infarction affecting unspecified side: Secondary | ICD-10-CM | POA: Diagnosis not present

## 2017-02-02 DIAGNOSIS — F330 Major depressive disorder, recurrent, mild: Secondary | ICD-10-CM | POA: Diagnosis not present

## 2017-02-16 DIAGNOSIS — Z7901 Long term (current) use of anticoagulants: Secondary | ICD-10-CM | POA: Diagnosis not present

## 2017-02-16 DIAGNOSIS — I69359 Hemiplegia and hemiparesis following cerebral infarction affecting unspecified side: Secondary | ICD-10-CM | POA: Diagnosis not present

## 2017-03-09 ENCOUNTER — Ambulatory Visit
Admission: RE | Admit: 2017-03-09 | Discharge: 2017-03-09 | Disposition: A | Discharge status: Home / Self Care | Payer: PPO | Source: Ambulatory Visit | Attending: Family Medicine | Admitting: Family Medicine

## 2017-03-09 DIAGNOSIS — Z1231 Encounter for screening mammogram for malignant neoplasm of breast: Secondary | ICD-10-CM | POA: Insufficient documentation

## 2017-03-09 DIAGNOSIS — Z1239 Encounter for other screening for malignant neoplasm of breast: Secondary | ICD-10-CM

## 2017-03-12 DIAGNOSIS — Z7901 Long term (current) use of anticoagulants: Secondary | ICD-10-CM | POA: Diagnosis not present

## 2017-03-12 DIAGNOSIS — I69359 Hemiplegia and hemiparesis following cerebral infarction affecting unspecified side: Secondary | ICD-10-CM | POA: Diagnosis not present

## 2017-03-24 DIAGNOSIS — I69359 Hemiplegia and hemiparesis following cerebral infarction affecting unspecified side: Secondary | ICD-10-CM | POA: Diagnosis not present

## 2017-03-24 DIAGNOSIS — Z7901 Long term (current) use of anticoagulants: Secondary | ICD-10-CM | POA: Diagnosis not present

## 2017-03-30 ENCOUNTER — Emergency Department: Payer: PPO

## 2017-03-30 ENCOUNTER — Emergency Department
Admission: EM | Admit: 2017-03-30 | Discharge: 2017-03-30 | Disposition: A | Discharge status: Home / Self Care | Payer: PPO | Attending: Emergency Medicine | Admitting: Emergency Medicine

## 2017-03-30 DIAGNOSIS — R42 Dizziness and giddiness: Secondary | ICD-10-CM | POA: Insufficient documentation

## 2017-03-30 DIAGNOSIS — Z7984 Long term (current) use of oral hypoglycemic drugs: Secondary | ICD-10-CM | POA: Insufficient documentation

## 2017-03-30 DIAGNOSIS — Z7901 Long term (current) use of anticoagulants: Secondary | ICD-10-CM | POA: Insufficient documentation

## 2017-03-30 DIAGNOSIS — E119 Type 2 diabetes mellitus without complications: Secondary | ICD-10-CM | POA: Diagnosis not present

## 2017-03-30 DIAGNOSIS — R51 Headache: Secondary | ICD-10-CM | POA: Insufficient documentation

## 2017-03-30 DIAGNOSIS — Z79899 Other long term (current) drug therapy: Secondary | ICD-10-CM | POA: Insufficient documentation

## 2017-03-30 DIAGNOSIS — I10 Essential (primary) hypertension: Secondary | ICD-10-CM | POA: Diagnosis not present

## 2017-03-30 DIAGNOSIS — R112 Nausea with vomiting, unspecified: Secondary | ICD-10-CM | POA: Diagnosis not present

## 2017-03-30 HISTORY — DX: Cerebral infarction, unspecified: I63.9

## 2017-03-30 HISTORY — DX: Essential (primary) hypertension: I10

## 2017-03-30 HISTORY — DX: Type 2 diabetes mellitus without complications: E11.9

## 2017-03-30 LAB — URINALYSIS, COMPLETE (UACMP) WITH MICROSCOPIC
Bilirubin Urine: NEGATIVE
Glucose, UA: NEGATIVE mg/dL
Hgb urine dipstick: NEGATIVE
Ketones, ur: 5 mg/dL — AB
Leukocytes, UA: NEGATIVE
Nitrite: NEGATIVE
PROTEIN: NEGATIVE mg/dL
SPECIFIC GRAVITY, URINE: 1.025 (ref 1.005–1.030)
pH: 5 (ref 5.0–8.0)

## 2017-03-30 LAB — CBC
HEMATOCRIT: 45.7 % (ref 35.0–47.0)
Hemoglobin: 15.3 g/dL (ref 12.0–16.0)
MCH: 30.2 pg (ref 26.0–34.0)
MCHC: 33.5 g/dL (ref 32.0–36.0)
MCV: 90 fL (ref 80.0–100.0)
PLATELETS: 327 10*3/uL (ref 150–440)
RBC: 5.08 MIL/uL (ref 3.80–5.20)
RDW: 13.5 % (ref 11.5–14.5)
WBC: 10 10*3/uL (ref 3.6–11.0)

## 2017-03-30 LAB — BASIC METABOLIC PANEL
Anion gap: 11 (ref 5–15)
BUN: 21 mg/dL — AB (ref 6–20)
CHLORIDE: 95 mmol/L — AB (ref 101–111)
CO2: 28 mmol/L (ref 22–32)
Calcium: 10.5 mg/dL — ABNORMAL HIGH (ref 8.9–10.3)
Creatinine, Ser: 0.97 mg/dL (ref 0.44–1.00)
GFR calc Af Amer: >60 mL/min (ref >60)
GFR calc non Af Amer: >60 mL/min (ref >60)
GLUCOSE: 144 mg/dL — AB (ref 65–99)
Potassium: 3.3 mmol/L — ABNORMAL LOW (ref 3.5–5.1)
Sodium: 134 mmol/L — ABNORMAL LOW (ref 135–145)

## 2017-03-30 MED ORDER — ONDANSETRON 4 MG PO TBDP: 4.0000 mg | ORAL_TABLET | Freq: Three times a day (TID) | ORAL | 0 refills | Status: DC | PRN

## 2017-03-30 MED ORDER — MECLIZINE HCL 25 MG PO TABS
25.0000 mg | ORAL_TABLET | Freq: Once | ORAL | Status: AC
  Administered 2017-03-30: 25 mg via ORAL
  Filled 2017-03-30: qty 1

## 2017-03-30 MED ORDER — ONDANSETRON HCL 4 MG/2ML IJ SOLN
4.0000 mg | Freq: Once | INTRAMUSCULAR | Status: AC
  Administered 2017-03-30: 4 mg via INTRAVENOUS
  Filled 2017-03-30: qty 2

## 2017-03-30 MED ORDER — MECLIZINE HCL 25 MG PO TABS: 25.0000 mg | ORAL_TABLET | Freq: Three times a day (TID) | ORAL | 0 refills | Status: DC | PRN

## 2017-03-30 MED ORDER — SODIUM CHLORIDE 0.9 % IV BOLUS (SEPSIS)
1000.0000 mL | Freq: Once | INTRAVENOUS | Status: AC
  Administered 2017-03-30: 1000 mL via INTRAVENOUS

## 2017-03-30 NOTE — ED Triage Notes (Signed)
Pt c/o having dizziness with HA N/v since yesterday.

## 2017-03-30 NOTE — ED Notes (Signed)
PIV d/c'd, right forearm #20 gauge.

## 2017-03-30 NOTE — ED Provider Notes (Signed)
[facility] Emergency Department Provider Note  Time seen: 5:54 PM  I have reviewed the triage vital signs and the nursing notes.   HISTORY  Chief Complaint Dizziness    HPI Sherri Jenkins is a 61 y.o. female with a past medical history of CVA, hypertension, diabetes, presents to the emergency department with dizziness and nausea and vomiting. According to the patient for the past 3 or 4 days she has been having dizziness intermittently. She states it'll happen while walking her sometimes while seated. She states all of a sudden the room starts spinning and she weak, extremely nauseated. States she has been having difficulty keeping food and liquids down over the past several days due to this intermittent nausea. Patient does take warfarin since her CVA multiple years ago. Patient uses a cane to walk. Patient denies any fever, cough congestion, abdominal pain or dysuria.  Past Medical History:  Diagnosis Date  . Diabetes mellitus without complication (HCC)   . Hypertension   . Stroke [facility])     There are no active problems to display for this patient.   History reviewed. No pertinent surgical history.  Prior to Admission medications   Medication Sig Start Date End Date Taking? Authorizing Provider  ALBUTEROL IN Inhale into the lungs.    Historical Provider, MD  cyclobenzaprine (FLEXERIL) 10 MG tablet Take 10 mg by mouth 3 (three) times daily as needed for muscle spasms.    Historical Provider, MD  fluticasone (FLONASE) 50 MCG/ACT nasal spray Place into both nostrils daily.    Historical Provider, MD  hydrochlorothiazide (HYDRODIURIL) 25 MG tablet Take 25 mg by mouth daily.    Historical Provider, MD  losartan (COZAAR) 25 MG tablet Take 25 mg by mouth daily.    Historical Provider, MD  metFORMIN (GLUCOPHAGE) 500 MG tablet Take by mouth 2 (two) times daily with a meal.    Historical Provider, MD  metoprolol succinate (TOPROL-XL) 100 MG 24 hr tablet Take 100 mg  by mouth daily. Take with or immediately following a meal.    Historical Provider, MD  PARoxetine (PAXIL) 40 MG tablet Take 40 mg by mouth every morning.    Historical Provider, MD  potassium gluconate 595 MG TABS tablet Take 595 mg by mouth.    Historical Provider, MD  warfarin (COUMADIN) 1 MG tablet Take 1 mg by mouth daily.    Historical Provider, MD  warfarin (COUMADIN) 3 MG tablet Take 3 mg by mouth daily.    Historical Provider, MD    Allergies  Allergen Reactions  . Atorvastatin   . Penicillins Hives  . Sulfa Antibiotics Rash    Family History  Problem Relation Age of Onset  . Breast cancer Sister 44  . Colon cancer Cousin     maternal    Social History Social History  Substance Use Topics  . Smoking status: Never Smoker  . Smokeless tobacco: Never Used  . Alcohol use No    Review of Systems Constitutional: Negative for fever. Cardiovascular: Negative for chest pain. Respiratory: Negative for shortness of breath. Gastrointestinal: Negative for abdominal pain. Positive for nausea and vomiting. Neurological: Mild headache 10-point ROS otherwise negative.  ____________________________________________   PHYSICAL EXAM:  VITAL SIGNS: ED Triage Vitals  Enc Vitals Group     BP 03/30/17 1445 (!) 147/89     Pulse Rate 03/30/17 1445 91     Resp 03/30/17 1445 18     Temp 03/30/17 1445 98 F (36.7 C)  Temp Source 03/30/17 1445 Oral     SpO2 03/30/17 1445 98 %     Weight 03/30/17 1447 147 lb (66.7 kg)     Height 03/30/17 1447 5' (1.524 m)     Head Circumference --      Peak Flow --      Pain Score 03/30/17 1445 2     Pain Loc --      Pain Edu? --      Excl. in GC? --     Constitutional: Alert and oriented. Well appearing and in no distress. Eyes: Normal exam, No nystagmus. ENT   Head: Normocephalic and atraumatic   Mouth/Throat: Somewhat dry mucous membranes. Cardiovascular: Normal rate, regular rhythm. No murmur Respiratory: Normal respiratory  effort without tachypnea nor retractions. Breath sounds are clear Gastrointestinal: Soft and nontender. No distention.   Musculoskeletal: Nontender with normal range of motion in all extremities.  Neurologic:  Normal speech and language. Able to move all extremities well. Patient does have baseline deficits from prior CVA. Denies any new weakness or numbness. Skin:  Skin is warm, dry and intact.  Psychiatric: Mood and affect are normal. Speech and behavior are normal.   ____________________________________________    EKG  EKG reviewed and interpreted by myself shows sinus rhythm at 83 bpm, somewhat widened QRS normal axis, normal intervals, nonspecific ST changes. No ST elevation.  ____________________________________________    RADIOLOGY  CT head negative  ____________________________________________   INITIAL IMPRESSION / ASSESSMENT AND PLAN / ED COURSE  Pertinent labs & imaging results that were available during my care of the patient were reviewed by me and considered in my medical decision making (see chart for details).  Patient presents to the emergency department for intermittent dizziness nausea and vomiting over the past several days. Patient symptoms she describing are very suggestive of BPPV. Patient's labs are largely at her baseline. She does appear to be mildly dehydrated with ketones and the urinalysis. We will treat with Zofran, IV fluids. Given the patient's history of CVA on warfarin and we'll obtain a CT scan of the head. Patient agreeable to this plan. Overall she appears well, no distress.  Patient's labs are largely within normal limits. The CT scan of her head is normal. Patient states he feels much better after fluids and medications. I discussed with the patient the likelihood of BPPV. We will prescribe meclizine as well as Zofran to be taken as needed. Patient will follow-up with ENT she continues to have dizziness. Currently she states she feels  well.  ____________________________________________   FINAL CLINICAL IMPRESSION(S) / ED DIAGNOSES  Dizziness Nausea and vomiting    Minna Antis, MD 03/30/17 1928

## 2017-03-30 NOTE — ED Notes (Signed)
Patient transported to CT 

## 2017-04-21 DIAGNOSIS — Z7901 Long term (current) use of anticoagulants: Secondary | ICD-10-CM | POA: Diagnosis not present

## 2017-04-21 DIAGNOSIS — I69359 Hemiplegia and hemiparesis following cerebral infarction affecting unspecified side: Secondary | ICD-10-CM | POA: Diagnosis not present

## 2017-05-04 DIAGNOSIS — I69359 Hemiplegia and hemiparesis following cerebral infarction affecting unspecified side: Secondary | ICD-10-CM | POA: Diagnosis not present

## 2017-05-04 DIAGNOSIS — Z7901 Long term (current) use of anticoagulants: Secondary | ICD-10-CM | POA: Diagnosis not present

## 2017-05-19 DIAGNOSIS — I69359 Hemiplegia and hemiparesis following cerebral infarction affecting unspecified side: Secondary | ICD-10-CM | POA: Diagnosis not present

## 2017-05-19 DIAGNOSIS — Z7901 Long term (current) use of anticoagulants: Secondary | ICD-10-CM | POA: Diagnosis not present

## 2017-06-03 DIAGNOSIS — R29810 Facial weakness: Secondary | ICD-10-CM | POA: Diagnosis not present

## 2017-06-03 DIAGNOSIS — I69359 Hemiplegia and hemiparesis following cerebral infarction affecting unspecified side: Secondary | ICD-10-CM | POA: Diagnosis not present

## 2017-06-03 DIAGNOSIS — Z7901 Long term (current) use of anticoagulants: Secondary | ICD-10-CM | POA: Diagnosis not present

## 2017-06-05 ENCOUNTER — Other Ambulatory Visit: Payer: Self-pay | Admitting: Family Medicine

## 2017-06-05 DIAGNOSIS — I69359 Hemiplegia and hemiparesis following cerebral infarction affecting unspecified side: Secondary | ICD-10-CM

## 2017-06-10 DIAGNOSIS — Z7901 Long term (current) use of anticoagulants: Secondary | ICD-10-CM | POA: Diagnosis not present

## 2017-06-10 DIAGNOSIS — M19041 Primary osteoarthritis, right hand: Secondary | ICD-10-CM | POA: Diagnosis not present

## 2017-06-10 DIAGNOSIS — I10 Essential (primary) hypertension: Secondary | ICD-10-CM | POA: Diagnosis not present

## 2017-06-10 DIAGNOSIS — I69359 Hemiplegia and hemiparesis following cerebral infarction affecting unspecified side: Secondary | ICD-10-CM | POA: Diagnosis not present

## 2017-06-10 DIAGNOSIS — F330 Major depressive disorder, recurrent, mild: Secondary | ICD-10-CM | POA: Diagnosis not present

## 2017-06-10 DIAGNOSIS — E663 Overweight: Secondary | ICD-10-CM | POA: Diagnosis not present

## 2017-06-10 DIAGNOSIS — E210 Primary hyperparathyroidism: Secondary | ICD-10-CM | POA: Diagnosis not present

## 2017-06-10 DIAGNOSIS — M160 Bilateral primary osteoarthritis of hip: Secondary | ICD-10-CM | POA: Diagnosis not present

## 2017-06-10 DIAGNOSIS — R29810 Facial weakness: Secondary | ICD-10-CM | POA: Diagnosis not present

## 2017-06-10 DIAGNOSIS — R413 Other amnesia: Secondary | ICD-10-CM | POA: Diagnosis not present

## 2017-06-10 DIAGNOSIS — E782 Mixed hyperlipidemia: Secondary | ICD-10-CM | POA: Diagnosis not present

## 2017-06-10 DIAGNOSIS — E119 Type 2 diabetes mellitus without complications: Secondary | ICD-10-CM | POA: Diagnosis not present

## 2017-06-15 ENCOUNTER — Ambulatory Visit
Admission: RE | Admit: 2017-06-15 | Discharge: 2017-06-15 | Disposition: A | Discharge status: Home / Self Care | Payer: PPO | Source: Ambulatory Visit | Attending: Family Medicine | Admitting: Family Medicine

## 2017-06-15 ENCOUNTER — Ambulatory Visit: Payer: PPO

## 2017-06-15 DIAGNOSIS — R410 Disorientation, unspecified: Secondary | ICD-10-CM | POA: Diagnosis not present

## 2017-06-15 DIAGNOSIS — R29810 Facial weakness: Secondary | ICD-10-CM | POA: Diagnosis not present

## 2017-06-15 DIAGNOSIS — I6782 Cerebral ischemia: Secondary | ICD-10-CM | POA: Diagnosis not present

## 2017-06-15 DIAGNOSIS — I69359 Hemiplegia and hemiparesis following cerebral infarction affecting unspecified side: Secondary | ICD-10-CM | POA: Insufficient documentation

## 2017-06-15 DIAGNOSIS — I63511 Cerebral infarction due to unspecified occlusion or stenosis of right middle cerebral artery: Secondary | ICD-10-CM | POA: Insufficient documentation

## 2017-06-15 LAB — POCT I-STAT CREATININE: Creatinine, Ser: 0.8 mg/dL (ref 0.44–1.00)

## 2017-06-15 MED ORDER — GADOBENATE DIMEGLUMINE 529 MG/ML IV SOLN
15.0000 mL | Freq: Once | INTRAVENOUS | Status: AC | PRN
  Administered 2017-06-15: 13 mL via INTRAVENOUS

## 2017-06-23 ENCOUNTER — Ambulatory Visit: Payer: PPO

## 2017-07-06 DIAGNOSIS — B001 Herpesviral vesicular dermatitis: Secondary | ICD-10-CM | POA: Diagnosis not present

## 2017-09-14 DIAGNOSIS — F3342 Major depressive disorder, recurrent, in full remission: Secondary | ICD-10-CM | POA: Diagnosis not present

## 2017-09-14 DIAGNOSIS — E663 Overweight: Secondary | ICD-10-CM | POA: Diagnosis not present

## 2017-09-14 DIAGNOSIS — Z803 Family history of malignant neoplasm of breast: Secondary | ICD-10-CM | POA: Diagnosis not present

## 2017-09-14 DIAGNOSIS — M19041 Primary osteoarthritis, right hand: Secondary | ICD-10-CM | POA: Diagnosis not present

## 2017-09-14 DIAGNOSIS — R413 Other amnesia: Secondary | ICD-10-CM | POA: Diagnosis not present

## 2017-09-14 DIAGNOSIS — E210 Primary hyperparathyroidism: Secondary | ICD-10-CM | POA: Diagnosis not present

## 2017-09-14 DIAGNOSIS — I10 Essential (primary) hypertension: Secondary | ICD-10-CM | POA: Diagnosis not present

## 2017-09-14 DIAGNOSIS — M160 Bilateral primary osteoarthritis of hip: Secondary | ICD-10-CM | POA: Diagnosis not present

## 2017-09-14 DIAGNOSIS — R809 Proteinuria, unspecified: Secondary | ICD-10-CM | POA: Diagnosis not present

## 2017-09-14 DIAGNOSIS — E782 Mixed hyperlipidemia: Secondary | ICD-10-CM | POA: Diagnosis not present

## 2017-09-14 DIAGNOSIS — I6930 Unspecified sequelae of cerebral infarction: Secondary | ICD-10-CM | POA: Diagnosis not present

## 2017-09-14 DIAGNOSIS — Z23 Encounter for immunization: Secondary | ICD-10-CM | POA: Diagnosis not present

## 2017-09-14 DIAGNOSIS — E119 Type 2 diabetes mellitus without complications: Secondary | ICD-10-CM | POA: Diagnosis not present

## 2017-10-05 DIAGNOSIS — I69319 Unspecified symptoms and signs involving cognitive functions following cerebral infarction: Secondary | ICD-10-CM | POA: Diagnosis not present

## 2017-12-11 DIAGNOSIS — E210 Primary hyperparathyroidism: Secondary | ICD-10-CM | POA: Diagnosis not present

## 2017-12-11 DIAGNOSIS — E663 Overweight: Secondary | ICD-10-CM | POA: Diagnosis not present

## 2017-12-11 DIAGNOSIS — M160 Bilateral primary osteoarthritis of hip: Secondary | ICD-10-CM | POA: Diagnosis not present

## 2017-12-11 DIAGNOSIS — M19041 Primary osteoarthritis, right hand: Secondary | ICD-10-CM | POA: Diagnosis not present

## 2017-12-11 DIAGNOSIS — I10 Essential (primary) hypertension: Secondary | ICD-10-CM | POA: Diagnosis not present

## 2017-12-11 DIAGNOSIS — E782 Mixed hyperlipidemia: Secondary | ICD-10-CM | POA: Diagnosis not present

## 2017-12-11 DIAGNOSIS — I6930 Unspecified sequelae of cerebral infarction: Secondary | ICD-10-CM | POA: Diagnosis not present

## 2017-12-11 DIAGNOSIS — F3342 Major depressive disorder, recurrent, in full remission: Secondary | ICD-10-CM | POA: Diagnosis not present

## 2017-12-11 DIAGNOSIS — E119 Type 2 diabetes mellitus without complications: Secondary | ICD-10-CM | POA: Diagnosis not present

## 2017-12-11 DIAGNOSIS — Z0000 Encounter for general adult medical examination without abnormal findings: Secondary | ICD-10-CM | POA: Diagnosis not present

## 2017-12-11 DIAGNOSIS — R413 Other amnesia: Secondary | ICD-10-CM | POA: Diagnosis not present

## 2017-12-11 DIAGNOSIS — Z803 Family history of malignant neoplasm of breast: Secondary | ICD-10-CM | POA: Diagnosis not present

## 2018-03-30 IMAGING — MR MR HEAD WO/W CM
12 series · 48 of 48 positions shown · IV contrast (multihance)
Comparison: Prior CT from 03/30/2017 as well as previous MRI from
01/21/2011.

CLINICAL DATA: Initial evaluation for right-sided weakness with
right-sided facial droop, confusion, imbalance.

EXAM:
MRI HEAD WITHOUT AND WITH CONTRAST
TECHNIQUE: Multiplanar, multiecho pulse sequences of the brain and surrounding
structures were obtained without and with intravenous contrast.
CONTRAST:  13mL MULTIHANCE GADOBENATE DIMEGLUMINE 529 MG/ML IV SOLN

[Series 2: T1 · sagittal · 5.0mm · 0.45mm/px · 2 of 26 slices shown (1 of 2)]
[im 1/26]
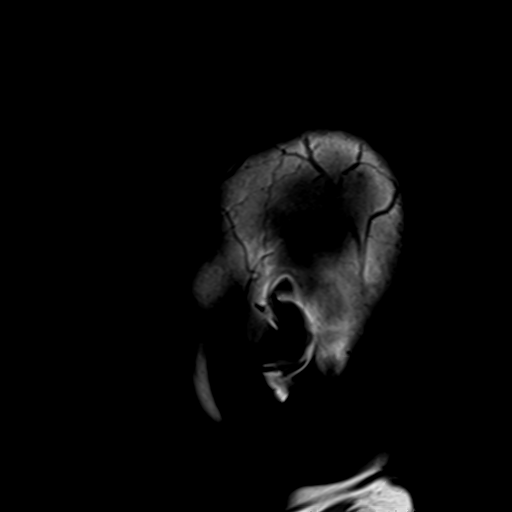
[im 26/26]
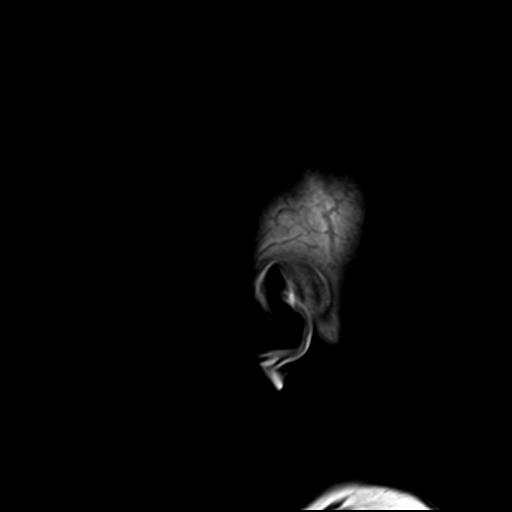

[Series 4: DWI · axial · 3.0mm · 1.80mm/px · z∈[-85,+71]mm · 3 of 53 slices shown (1 of 2)]
[im 1/53]
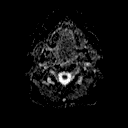
[im 27/53]
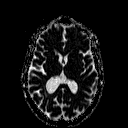
[im 53/53]
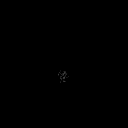

[Series 6: DWI · coronal · 3.0mm · 1.80mm/px · 3 of 45 slices shown (2 of 2)]
[im 1/45]
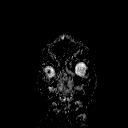
[im 23/45]
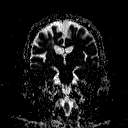
[im 45/45]
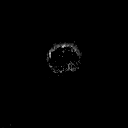

[Series 7: T2 · axial · 5.0mm · 0.60mm/px · z∈[-98,+78]mm · 2 of 28 slices shown (1 of 2)]
[im 1/28]
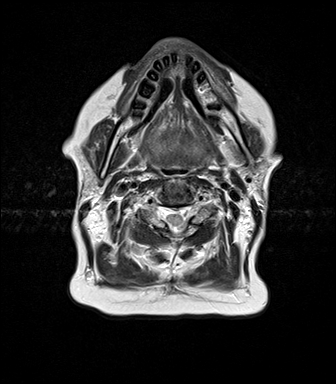
[im 28/28]
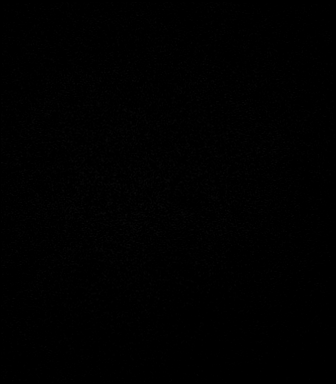

[Series 8: FLAIR · axial · 3.0mm · 0.45mm/px · z∈[-88,+68]mm · 3 of 53 slices shown]
[im 1/53]
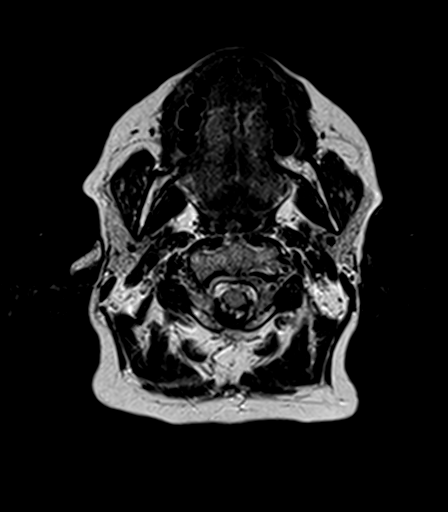
[im 27/53]
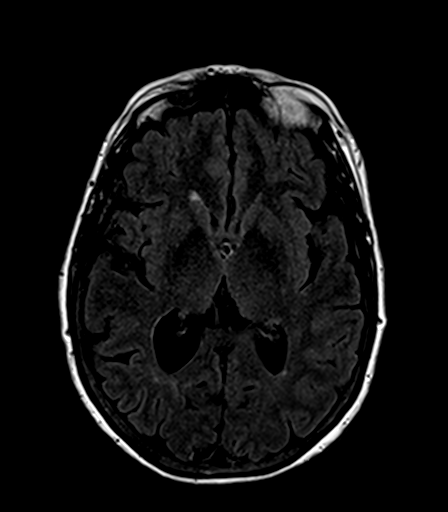
[im 53/53]
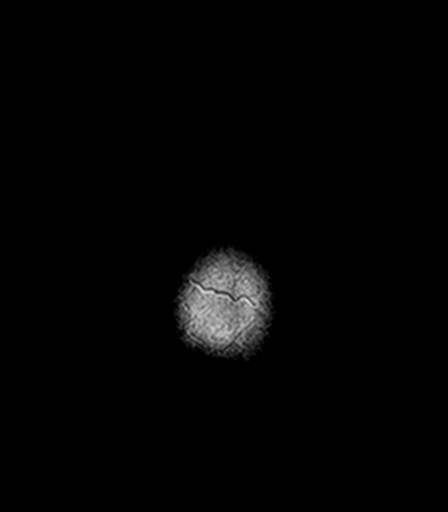

[Series 9: T2 · axial · 5.0mm · 0.45mm/px · z∈[-98,+78]mm · 2 of 28 slices shown (2 of 2)]
[im 1/28]
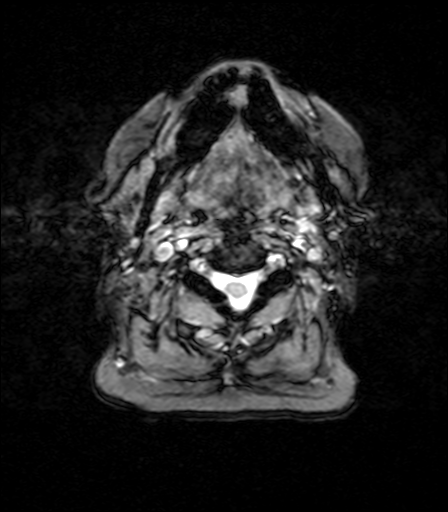
[im 28/28]
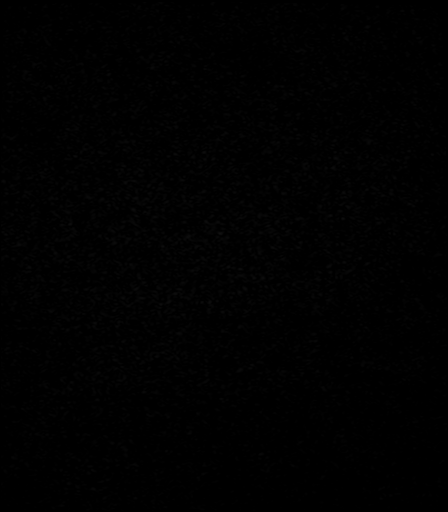

[Series 10: T1 · axial · 1.0mm · 0.90mm/px · z∈[-98,+77]mm · 11 of 176 slices shown (2 of 2)]
[im 1/176]
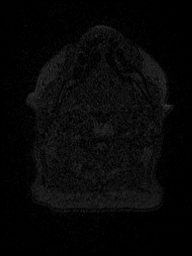
[im 18/176]
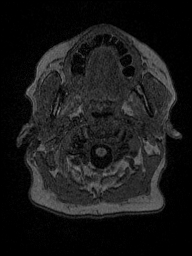
[im 36/176]
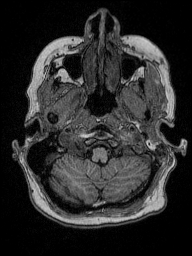
[im 53/176]
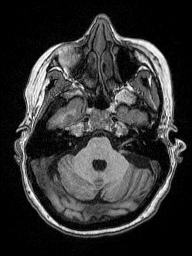
[im 71/176]
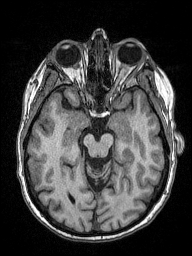
[im 88/176]
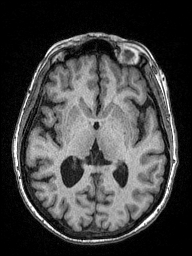
[im 106/176]
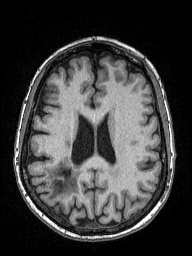
[im 123/176]
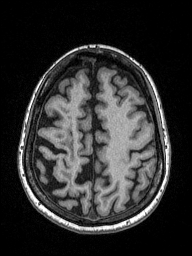
[im 141/176]
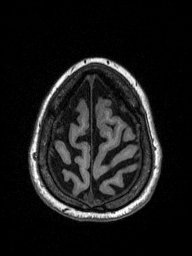
[im 158/176]
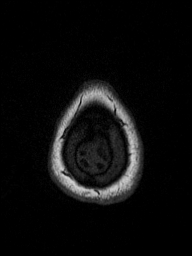
[im 176/176]
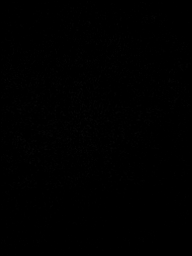

[Series 11: T2 post-contrast · coronal · 5.0mm · 0.49mm/px · 2 of 27 slices shown]
[im 1/27]
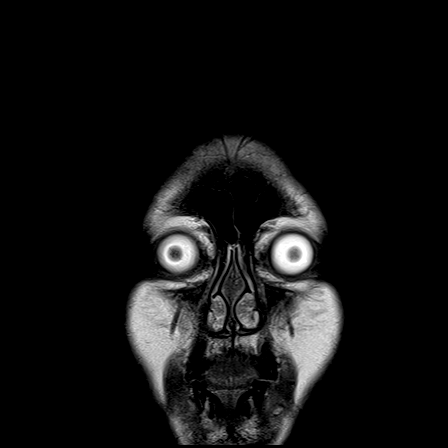
[im 27/27]
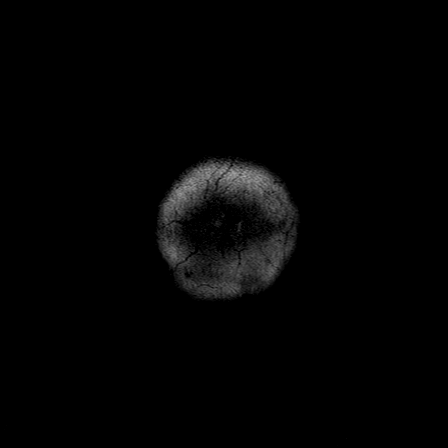

[Series 12: T1 post-contrast · axial · 1.0mm · 1.00mm/px · z∈[-98,+77]mm · 11 of 176 slices shown (1 of 2)]
[im 1/176]
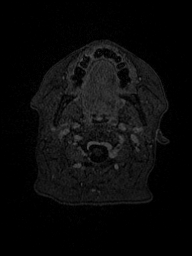
[im 18/176]
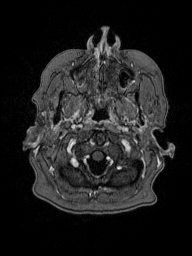
[im 36/176]
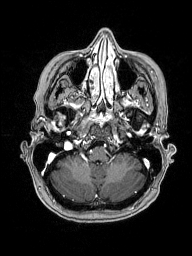
[im 53/176]
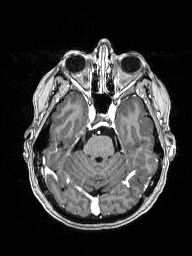
[im 71/176]
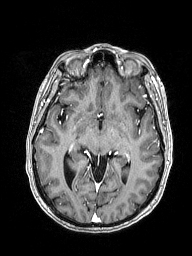
[im 88/176]
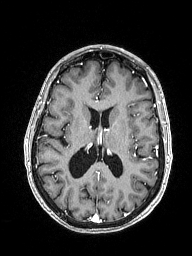
[im 106/176]
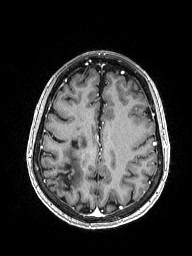
[im 123/176]
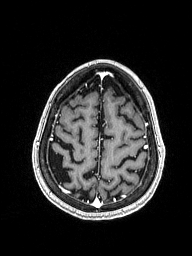
[im 141/176]
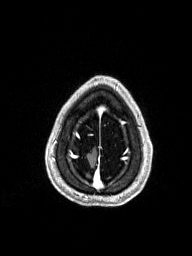
[im 158/176]
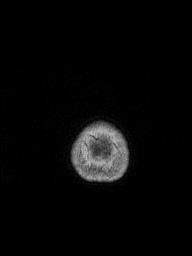
[im 176/176]
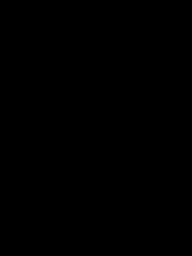

[Series 13: T1 post-contrast · coronal · 5.0mm · 0.43mm/px · 2 of 27 slices shown (2 of 2)]
[im 1/27]
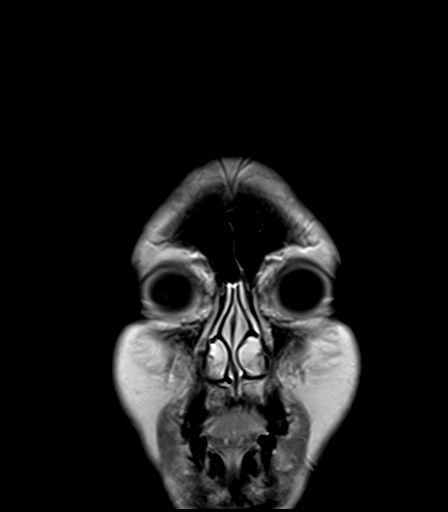
[im 27/27]
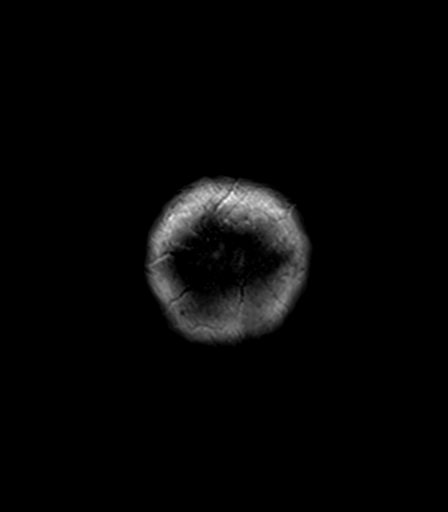

[Series 100: ax (id) · axial · 3.0mm · 1.80mm/px · z∈[-85,+89]mm · 4 of 59 slices shown]
[im 1/59]
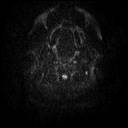
[im 20/59]
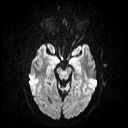
[im 39/59]
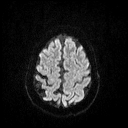
[im 59/59]
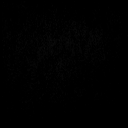

[Series 101: cor (id) · coronal · 3.0mm · 1.80mm/px · 3 of 45 slices shown]
[im 1/45]
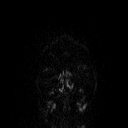
[im 23/45]
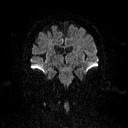
[im 45/45]
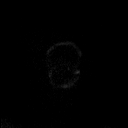

[48 of 48 positions shown; findings below may reference images not displayed]

FINDINGS: Brain: Stable cerebral volume, within normal limits for age. Mild
patchy T2/FLAIR hyperintensity within the periventricular white
matter most consistent with chronic small vessel ischemic disease,
mild for age. Multifocal encephalomalacia with gliosis present
within the periventricular white matter of the right frontal
parietal region extending into the overlying cortex, compatible with
remote right posterior MCA territory infarct. Overall, appearance is
stable from previous.

No abnormal foci of restricted diffusion to suggest acute or
subacute ischemia. Gray-white matter differentiation otherwise
maintained. No other areas of chronic infarction identified. No
acute or chronic intracranial hemorrhage.

No mass lesion, midline shift or mass effect. No hydrocephalus. No
extra-axial fluid collection. Major dural sinuses are grossly
patent. No abnormal enhancement.

Incidental note made of a partially empty sella. Suprasellar region
normal. Midline structures intact and normal.

Vascular: Major intracranial vascular flow voids are maintained.

Skull and upper cervical spine: Craniocervical junction normal.
Visualized upper cervical spine unremarkable. Bone marrow signal
intensity within normal limits. No scalp soft tissue abnormality.

Sinuses/Orbits: Globes and orbital soft tissues within normal
limits. Paranasal sinuses are clear. No mastoid effusion. Inner ear
structures grossly normal.
IMPRESSION: 1. No acute intracranial process identified. No evidence for acute
or subacute ischemia.
2. Chronic posterior right MCA territory infarct, stable from
previous.
3. Mild chronic microvascular ischemic disease for age.

## 2018-04-15 ENCOUNTER — Other Ambulatory Visit: Payer: Self-pay | Admitting: Family Medicine

## 2018-04-15 DIAGNOSIS — Z1231 Encounter for screening mammogram for malignant neoplasm of breast: Secondary | ICD-10-CM

## 2018-05-03 ENCOUNTER — Ambulatory Visit
Admission: RE | Admit: 2018-05-03 | Discharge: 2018-05-03 | Disposition: A | Discharge status: Home / Self Care | Payer: Medicare HMO | Source: Ambulatory Visit | Attending: Family Medicine | Admitting: Family Medicine

## 2018-05-03 DIAGNOSIS — Z1231 Encounter for screening mammogram for malignant neoplasm of breast: Secondary | ICD-10-CM | POA: Diagnosis present

## 2019-06-14 ENCOUNTER — Other Ambulatory Visit: Payer: Self-pay | Admitting: Family Medicine

## 2019-06-14 DIAGNOSIS — Z1231 Encounter for screening mammogram for malignant neoplasm of breast: Secondary | ICD-10-CM

## 2019-07-15 ENCOUNTER — Ambulatory Visit
Admission: RE | Admit: 2019-07-15 | Discharge: 2019-07-15 | Disposition: A | Discharge status: Home / Self Care | Payer: Medicare HMO | Source: Ambulatory Visit | Attending: Family Medicine | Admitting: Family Medicine

## 2019-07-15 ENCOUNTER — Other Ambulatory Visit: Payer: Self-pay | Admitting: Family Medicine

## 2019-07-15 DIAGNOSIS — Z1231 Encounter for screening mammogram for malignant neoplasm of breast: Secondary | ICD-10-CM | POA: Insufficient documentation

## 2020-06-29 ENCOUNTER — Other Ambulatory Visit: Payer: Self-pay | Admitting: Family Medicine

## 2020-06-29 DIAGNOSIS — Z1231 Encounter for screening mammogram for malignant neoplasm of breast: Secondary | ICD-10-CM

## 2020-07-17 ENCOUNTER — Ambulatory Visit
Admission: RE | Admit: 2020-07-17 | Discharge: 2020-07-17 | Disposition: A | Discharge status: Home / Self Care | Payer: Medicare HMO | Source: Ambulatory Visit | Attending: Family Medicine | Admitting: Family Medicine

## 2020-07-17 DIAGNOSIS — Z1231 Encounter for screening mammogram for malignant neoplasm of breast: Secondary | ICD-10-CM | POA: Diagnosis present

## 2021-05-30 ENCOUNTER — Other Ambulatory Visit: Payer: Self-pay

## 2021-05-30 ENCOUNTER — Inpatient Hospital Stay
Admission: EM | Admit: 2021-05-30 | Discharge: 2021-06-04 | DRG: 229 | Disposition: A | Discharge status: Home / Self Care | Payer: Medicare Other | Attending: Internal Medicine | Admitting: Internal Medicine

## 2021-05-30 DIAGNOSIS — Z7901 Long term (current) use of anticoagulants: Secondary | ICD-10-CM

## 2021-05-30 DIAGNOSIS — Z79899 Other long term (current) drug therapy: Secondary | ICD-10-CM | POA: Diagnosis not present

## 2021-05-30 DIAGNOSIS — E119 Type 2 diabetes mellitus without complications: Secondary | ICD-10-CM

## 2021-05-30 DIAGNOSIS — Z7984 Long term (current) use of oral hypoglycemic drugs: Secondary | ICD-10-CM

## 2021-05-30 DIAGNOSIS — Z20822 Contact with and (suspected) exposure to covid-19: Secondary | ICD-10-CM | POA: Diagnosis present

## 2021-05-30 DIAGNOSIS — Z8673 Personal history of transient ischemic attack (TIA), and cerebral infarction without residual deficits: Secondary | ICD-10-CM

## 2021-05-30 DIAGNOSIS — Z803 Family history of malignant neoplasm of breast: Secondary | ICD-10-CM

## 2021-05-30 DIAGNOSIS — Z800 Family history of malignant neoplasm of digestive organs: Secondary | ICD-10-CM

## 2021-05-30 DIAGNOSIS — I442 Atrioventricular block, complete: Principal | ICD-10-CM | POA: Diagnosis present

## 2021-05-30 DIAGNOSIS — Z880 Allergy status to penicillin: Secondary | ICD-10-CM

## 2021-05-30 DIAGNOSIS — E1151 Type 2 diabetes mellitus with diabetic peripheral angiopathy without gangrene: Secondary | ICD-10-CM | POA: Diagnosis present

## 2021-05-30 DIAGNOSIS — R42 Dizziness and giddiness: Secondary | ICD-10-CM | POA: Diagnosis not present

## 2021-05-30 DIAGNOSIS — I447 Left bundle-branch block, unspecified: Secondary | ICD-10-CM | POA: Diagnosis present

## 2021-05-30 DIAGNOSIS — R531 Weakness: Secondary | ICD-10-CM

## 2021-05-30 DIAGNOSIS — Z91018 Allergy to other foods: Secondary | ICD-10-CM

## 2021-05-30 DIAGNOSIS — E785 Hyperlipidemia, unspecified: Secondary | ICD-10-CM | POA: Diagnosis present

## 2021-05-30 DIAGNOSIS — I10 Essential (primary) hypertension: Secondary | ICD-10-CM | POA: Diagnosis present

## 2021-05-30 DIAGNOSIS — E782 Mixed hyperlipidemia: Secondary | ICD-10-CM | POA: Insufficient documentation

## 2021-05-30 DIAGNOSIS — R059 Cough, unspecified: Secondary | ICD-10-CM | POA: Diagnosis not present

## 2021-05-30 DIAGNOSIS — Z882 Allergy status to sulfonamides status: Secondary | ICD-10-CM | POA: Diagnosis not present

## 2021-05-30 LAB — CBC WITH DIFFERENTIAL/PLATELET
Abs Immature Granulocytes: 0.02 10*3/uL (ref 0.00–0.07)
Basophils Absolute: 0.1 10*3/uL (ref 0.0–0.1)
Basophils Relative: 1 %
Eosinophils Absolute: 0.3 10*3/uL (ref 0.0–0.5)
Eosinophils Relative: 4 %
HCT: 43.2 % (ref 36.0–46.0)
Hemoglobin: 14.2 g/dL (ref 12.0–15.0)
Immature Granulocytes: 0 %
Lymphocytes Relative: 27 %
Lymphs Abs: 2.3 10*3/uL (ref 0.7–4.0)
MCH: 29.7 pg (ref 26.0–34.0)
MCHC: 32.9 g/dL (ref 30.0–36.0)
MCV: 90.4 fL (ref 80.0–100.0)
Monocytes Absolute: 0.7 10*3/uL (ref 0.1–1.0)
Monocytes Relative: 8 %
Neutro Abs: 5.3 10*3/uL (ref 1.7–7.7)
Neutrophils Relative %: 60 %
Platelets: 290 10*3/uL (ref 150–400)
RBC: 4.78 MIL/uL (ref 3.87–5.11)
RDW: 13.4 % (ref 11.5–15.5)
WBC: 8.7 10*3/uL (ref 4.0–10.5)
nRBC: 0 % (ref 0.0–0.2)

## 2021-05-30 LAB — BASIC METABOLIC PANEL
Anion gap: 9 (ref 5–15)
BUN: 26 mg/dL — ABNORMAL HIGH (ref 8–23)
CO2: 26 mmol/L (ref 22–32)
Calcium: 9.9 mg/dL (ref 8.9–10.3)
Chloride: 101 mmol/L (ref 98–111)
Creatinine, Ser: 1 mg/dL (ref 0.44–1.00)
GFR, Estimated: >60 mL/min (ref >60)
Glucose, Bld: 126 mg/dL — ABNORMAL HIGH (ref 70–99)
Potassium: 3.7 mmol/L (ref 3.5–5.1)
Sodium: 136 mmol/L (ref 135–145)

## 2021-05-30 LAB — RESP PANEL BY RT-PCR (FLU A&B, COVID) ARPGX2
Influenza A by PCR: NEGATIVE
Influenza B by PCR: NEGATIVE
SARS Coronavirus 2 by RT PCR: NEGATIVE

## 2021-05-30 LAB — TROPONIN I (HIGH SENSITIVITY)
Troponin I (High Sensitivity): 11 ng/L (ref <18)
Troponin I (High Sensitivity): 14 ng/L (ref <18)

## 2021-05-30 LAB — TSH: TSH: 1.404 u[IU]/mL (ref 0.350–4.500)

## 2021-05-30 LAB — T4, FREE: Free T4: 0.93 ng/dL (ref 0.61–1.12)

## 2021-05-30 MED ORDER — APIXABAN 5 MG PO TABS: 5.0000 mg | ORAL_TABLET | Freq: Two times a day (BID) | ORAL | Status: DC

## 2021-05-30 MED ORDER — ACETAMINOPHEN 650 MG RE SUPP: 650.0000 mg | Freq: Four times a day (QID) | RECTAL | Status: DC | PRN

## 2021-05-30 MED ORDER — PAROXETINE HCL 20 MG PO TABS: 40.0000 mg | ORAL_TABLET | ORAL | Status: DC

## 2021-05-30 MED ORDER — DONEPEZIL HCL 5 MG PO TABS: 10.0000 mg | ORAL_TABLET | Freq: Every day | ORAL | Status: DC

## 2021-05-30 MED ORDER — LOSARTAN POTASSIUM 50 MG PO TABS: 25.0000 mg | ORAL_TABLET | Freq: Every day | ORAL | Status: DC

## 2021-05-30 MED ORDER — SODIUM CHLORIDE 0.9 % IV SOLN: INTRAVENOUS | Status: AC

## 2021-05-30 MED ORDER — ROSUVASTATIN CALCIUM 5 MG PO TABS
2.5000 mg | ORAL_TABLET | ORAL | Status: DC
  Administered 2021-05-31 – 2021-06-03 (×2): 2.5 mg via ORAL
  Filled 2021-05-30: qty 0.5
  Filled 2021-05-30: qty 1

## 2021-05-30 MED ORDER — FUROSEMIDE 10 MG/ML IJ SOLN
10.0000 mg | Freq: Once | INTRAMUSCULAR | Status: AC
  Administered 2021-05-31: 10 mg via INTRAVENOUS
  Filled 2021-05-30: qty 4

## 2021-05-30 MED ORDER — ASPIRIN EC 81 MG PO TBEC
81.0000 mg | DELAYED_RELEASE_TABLET | Freq: Every day | ORAL | Status: DC
  Administered 2021-05-31 – 2021-06-04 (×4): 81 mg via ORAL
  Filled 2021-05-30 (×5): qty 1

## 2021-05-30 MED ORDER — HYDROCHLOROTHIAZIDE 25 MG PO TABS: 25.0000 mg | ORAL_TABLET | Freq: Every day | ORAL | Status: DC

## 2021-05-30 MED ORDER — SODIUM CHLORIDE 0.9% FLUSH
3.0000 mL | Freq: Two times a day (BID) | INTRAVENOUS | Status: DC
  Administered 2021-05-30 – 2021-06-04 (×9): 3 mL via INTRAVENOUS

## 2021-05-30 MED ORDER — ACETAMINOPHEN 325 MG PO TABS: 650.0000 mg | ORAL_TABLET | Freq: Four times a day (QID) | ORAL | Status: DC | PRN

## 2021-05-30 NOTE — ED Provider Notes (Signed)
[facility] Emergency Department Provider Note    ____________________________________________   I have reviewed the triage vital signs and the nursing notes.   HISTORY  Chief Complaint Bradycardia   History limited by: Not Limited   HPI Sherri Jenkins is a 65 y.o. female who presents to the emergency department today from PCPs office today because of concern for slow heart rate. She states that she went to her PCPs office for an annual exam. When she was there she told the provider that she has been feeling weak for the past month. The patient states that it is most apparent with exercise. The patient denies any new medications or changes in medication. She denies any central or left chest pain, did have some sharp right sided chest pain yesterday. The patient denies any recent illness or fevers.     Records reviewed. Per medical record review patient has a history of DM, HTN, CVA.   Past Medical History:  Diagnosis Date   Diabetes mellitus without complication (HCC)    Hypertension    Stroke (HCC)     There are no problems to display for this patient.   History reviewed. No pertinent surgical history.  Prior to Admission medications   Medication Sig Start Date End Date Taking? Authorizing Provider  ALBUTEROL IN Inhale into the lungs.    [provider]  cyclobenzaprine (FLEXERIL) 10 MG tablet Take 10 mg by mouth 3 (three) times daily as needed for muscle spasms.    [provider]  fluticasone (FLONASE) 50 MCG/ACT nasal spray Place into both nostrils daily.    [provider]  hydrochlorothiazide (HYDRODIURIL) 25 MG tablet Take 25 mg by mouth daily.    [provider]  losartan (COZAAR) 25 MG tablet Take 25 mg by mouth daily.    [provider]  meclizine (ANTIVERT) 25 MG tablet Take 1 tablet (25 mg total) by mouth 3 (three) times daily as needed for dizziness. 03/30/17   Minna Antis, MD  metFORMIN  (GLUCOPHAGE) 500 MG tablet Take by mouth 2 (two) times daily with a meal.    [provider]  metoprolol succinate (TOPROL-XL) 100 MG 24 hr tablet Take 100 mg by mouth daily. Take with or immediately following a meal.    [provider]  ondansetron (ZOFRAN ODT) 4 MG disintegrating tablet Take 1 tablet (4 mg total) by mouth every 8 (eight) hours as needed for nausea or vomiting. 03/30/17   Minna Antis, MD  PARoxetine (PAXIL) 40 MG tablet Take 40 mg by mouth every morning.    [provider]  potassium gluconate 595 MG TABS tablet Take 595 mg by mouth.    [provider]  warfarin (COUMADIN) 1 MG tablet Take 1 mg by mouth daily.    [provider]  warfarin (COUMADIN) 3 MG tablet Take 3 mg by mouth daily.    [provider]    Allergies Atorvastatin, Penicillins, and Sulfa antibiotics  Family History  Problem Relation Age of Onset   Breast cancer Sister 81   Colon cancer Cousin        maternal    Social History Social History   Tobacco Use   Smoking status: Never   Smokeless tobacco: Never  Substance Use Topics   Alcohol use: No    Review of Systems Constitutional: No fever/chills. Positive for generalized weakness.  Eyes: No visual changes. ENT: No sore throat. Cardiovascular: Denies chest pain. Respiratory: Denies shortness of breath. Gastrointestinal: No  abdominal pain.  No nausea, no vomiting.  No diarrhea.   Genitourinary: Negative for dysuria. Musculoskeletal: Negative for back pain. Skin: Negative for rash. Neurological: Negative for headaches, focal weakness or numbness.  ____________________________________________   PHYSICAL EXAM:  VITAL SIGNS: ED Triage Vitals  Enc Vitals Group     BP --      Pulse Rate 05/30/21 1539 66     Resp 05/30/21 1539 14     Temp 05/30/21 1544 98 F (36.7 C)     Temp Source 05/30/21 1539 Oral     SpO2 05/30/21 1539 99 %     Weight 05/30/21 1540 145 lb (65.8 kg)      Height 05/30/21 1540 5' (1.524 m)     Head Circumference --      Peak Flow --      Pain Score 05/30/21 1540 0   Constitutional: Alert and oriented.  Eyes: Conjunctivae are normal.  ENT      Head: Normocephalic and atraumatic.      Nose: No congestion/rhinnorhea.      Mouth/Throat: Mucous membranes are moist.      Neck: No stridor. Hematological/Lymphatic/Immunilogical: No cervical lymphadenopathy. Cardiovascular: Bradycardic regular rhythm.  No murmurs, rubs, or gallops.  Respiratory: Normal respiratory effort without tachypnea nor retractions. Breath sounds are clear and equal bilaterally. No wheezes/rales/rhonchi. Gastrointestinal: Soft and non tender. No rebound. No guarding.  Genitourinary: Deferred Musculoskeletal: Normal range of motion in all extremities. No lower extremity edema. Neurologic:  Normal speech and language. No gross focal neurologic deficits are appreciated.  Skin:  Skin is warm, dry and intact. No rash noted. Psychiatric: Mood and affect are normal. Speech and behavior are normal. Patient exhibits appropriate insight and judgment.  ____________________________________________    LABS (pertinent positives/negatives)  COVID and flu negative Trop hs 11 BMP wnl except glu 126, BUN 26 CBC wbc 8.7, hgb 14.2, plt 290  ____________________________________________   EKG  I, Phineas Semen, attending physician, personally viewed and interpreted this EKG  EKG Time: 1530 Rate: 28 Rhythm: 3rd degree AV block  Axis: left axis deviation Intervals: qtc 407 QRS: LBBB ST changes: no st elevation Impression: abnormal ekg  ____________________________________________    RADIOLOGY  None  ____________________________________________   PROCEDURES  Procedures  ____________________________________________   INITIAL IMPRESSION / ASSESSMENT AND PLAN / ED COURSE  Pertinent labs & imaging results that were available during my care of the patient were  reviewed by me and considered in my medical decision making (see chart for details).   Patient presented to the emergency department today from primary care doctor's office because of concerns for bradycardia.  Patient does states she has felt weak over the past month.  EKG here is consistent with complete heart block.  While pacer pads were placed she was not actively paced here in emergency department given good mentation and blood pressure.  Discussed with Dr. Gwen Pounds with cardiology.  Will plan on admission to the hospital service.  ____________________________________________   FINAL CLINICAL IMPRESSION(S) / ED DIAGNOSES  Final diagnoses:  Weakness  Complete heart block (HCC)     Note: This dictation was prepared with Dragon dictation. Any transcriptional errors that result from this process are unintentional     Phineas Semen, MD 05/30/21 1742

## 2021-05-30 NOTE — ED Triage Notes (Signed)
Pt taken to room after EKG done, HR 30. EDP notified and in room

## 2021-05-30 NOTE — H&P (Addendum)
History and Physical    [name] ION:[id number] DOB: 06-05-1956 DOA: 05/30/2021  PCP: Laurine Blazer, MD    Patient coming from:  Primary care provider's office   Chief Complaint:  Dizziness and fatigue and tiredness   HPI: Sherri Jenkins is a 65 y.o. female seen in ed with complaints of dizziness and fatigue and tiredness.  Patient was sent to [place] from the primary care's office as she was found to have slow heart rate.  Per report patient has been feeling progressively weak over the past month.  Exercise and exertion makes her fatigue and tiredness and dizziness worse.  Patient denies any chest pain shortness of breath or palpitations.  Review of systems is otherwise negative for any blurred vision, speech issues difficulty with gait, abdominal issues palpitations, rash or joint issues nausea vomiting, diarrhea, urinary issues, bowel issues.  Pt has past medical history of past medical history of diabetes, hypertension, history of stroke.  ED Course:  Vitals:   05/30/21 1731 05/30/21 1832 05/30/21 1900 05/30/21 2021  BP:  100/60 (!) 110/50 (!) 98/50  Pulse: (!) 53 (!) 27 (!) 29 (!) 28  Resp: 17 17 16 17   Temp:      TempSrc:      SpO2: 99% 96% 96% 99%  Weight:      Height:      In the emergency room patient is alert awake oriented afebrile. Initial EKG done in the emergency room shows a heart rate of 28, third-degree AV block with left axis deviation corrected QT is 407 and QRS shows left bundle branch block with no ST changes. Cardiology has been contacted and patient is to be admitted with discontinuation of patient's home medication which her metoprolol, evaluation and pacemaker placement if indicated.   Review of Systems:  Review of Systems  Constitutional:  Positive for malaise/fatigue.  All other systems reviewed and are negative.   Past Medical History:  Diagnosis Date   Diabetes mellitus without complication (HCC)    Hypertension    Stroke [facility])     Past  Surgical History:  Procedure Laterality Date   TONSILLECTOMY Bilateral      reports that she has never smoked. She has never used smokeless tobacco. She reports that she does not drink alcohol and does not use drugs.  Allergies  Allergen Reactions   Atorvastatin    Penicillins Hives   Sulfa Antibiotics Rash    Family History  Problem Relation Age of Onset   Heart Problems Mother    Heart Problems Father    Breast cancer Sister 19   Colon cancer Cousin        maternal    Prior to Admission medications   Medication Sig Start Date End Date Taking? Authorizing Provider  ALBUTEROL IN Inhale into the lungs.    [provider]  cyclobenzaprine (FLEXERIL) 10 MG tablet Take 10 mg by mouth 3 (three) times daily as needed for muscle spasms.    [provider]  fluticasone (FLONASE) 50 MCG/ACT nasal spray Place into both nostrils daily.    [provider]  hydrochlorothiazide (HYDRODIURIL) 25 MG tablet Take 25 mg by mouth daily.    [provider]  losartan (COZAAR) 25 MG tablet Take 25 mg by mouth daily.    [provider]  meclizine (ANTIVERT) 25 MG tablet Take 1 tablet (25 mg total) by mouth 3 (three) times daily as needed for dizziness. 03/30/17   05/30/17, MD  metFORMIN (GLUCOPHAGE) 500  MG tablet Take by mouth 2 (two) times daily with a meal.    [provider]  metoprolol succinate (TOPROL-XL) 100 MG 24 hr tablet Take 100 mg by mouth daily. Take with or immediately following a meal.    [provider]  ondansetron (ZOFRAN ODT) 4 MG disintegrating tablet Take 1 tablet (4 mg total) by mouth every 8 (eight) hours as needed for nausea or vomiting. 03/30/17   Minna Antis, MD  PARoxetine (PAXIL) 40 MG tablet Take 40 mg by mouth every morning.    [provider]  potassium gluconate 595 MG TABS tablet Take 595 mg by mouth.    [provider]  warfarin (COUMADIN) 1 MG tablet Take 1 mg by mouth daily.     [provider]  warfarin (COUMADIN) 3 MG tablet Take 3 mg by mouth daily.    [provider]    Physical Exam: Vitals:   05/30/21 1731 05/30/21 1832 05/30/21 1900 05/30/21 2021  BP:  100/60 (!) 110/50 (!) 98/50  Pulse: (!) 53 (!) 27 (!) 29 (!) 28  Resp: 17 17 16 17   Temp:      TempSrc:      SpO2: 99% 96% 96% 99%  Weight:      Height:       Physical Exam Vitals and nursing note reviewed.  Constitutional:      Appearance: Normal appearance.  HENT:     Head: Normocephalic and atraumatic.     Right Ear: External ear normal.     Left Ear: External ear normal.     Nose: Nose normal.     Mouth/Throat:     Mouth: Mucous membranes are moist.  Eyes:     Extraocular Movements: Extraocular movements intact.     Pupils: Pupils are equal, round, and reactive to light.  Neck:     Vascular: No carotid bruit.  Cardiovascular:     Rate and Rhythm: Regular rhythm. Bradycardia present.     Pulses: Normal pulses.     Heart sounds: Normal heart sounds.  Pulmonary:     Effort: Pulmonary effort is normal.     Breath sounds: Normal breath sounds.  Abdominal:     General: Bowel sounds are normal. There is no distension.     Palpations: Abdomen is soft. There is no mass.     Tenderness: There is no abdominal tenderness. There is no guarding.     Hernia: No hernia is present.  Musculoskeletal:     Right lower leg: No edema.     Left lower leg: No edema.  Skin:    General: Skin is warm.  Neurological:     General: No focal deficit present.     Mental Status: She is alert and oriented to person, place, and time.     Cranial Nerves: No cranial nerve deficit.     Motor: No weakness.  Psychiatric:        Mood and Affect: Mood normal.        Behavior: Behavior normal.     Labs on Admission: I have personally reviewed following labs and imaging studies  No results for input(s): CKTOTAL, CKMB, TROPONINI in the last 72 hours. Lab Results  Component Value Date   WBC 8.7  05/30/2021   HGB 14.2 05/30/2021   HCT 43.2 05/30/2021   MCV 90.4 05/30/2021   PLT 290 05/30/2021    Recent Labs  Lab 05/30/21 1537  NA 136  K 3.7  CL 101  CO2 26  BUN 26*  CREATININE 1.00  CALCIUM 9.9  GLUCOSE 126*   No results found for: CHOL, HDL, LDLCALC, TRIG No results found for: DDIMER Invalid input(s): POCBNP  Urinalysis    Component Value Date/Time   COLORURINE YELLOW (A) 03/30/2017 1449   APPEARANCEUR HAZY (A) 03/30/2017 1449   LABSPEC 1.025 03/30/2017 1449   PHURINE 5.0 03/30/2017 1449   GLUCOSEU NEGATIVE 03/30/2017 1449   HGBUR NEGATIVE 03/30/2017 1449   BILIRUBINUR NEGATIVE 03/30/2017 1449   KETONESUR 5 (A) 03/30/2017 1449   PROTEINUR NEGATIVE 03/30/2017 1449   NITRITE NEGATIVE 03/30/2017 1449   LEUKOCYTESUR NEGATIVE 03/30/2017 1449   COVID-19 Labs No results for input(s): DDIMER, FERRITIN, LDH, CRP in the last 72 hours. Lab Results  Component Value Date   SARSCOV2NAA NEGATIVE 05/30/2021    Radiological Exams on Admission: No results found.  EKG: Independently reviewed.  Rate of 28, third-degree heart block, left axis deviation.    Assessment/Plan Principal Problem:   Dizziness, nonspecific Active Problems:   Heart block AV third degree (HCC)   Complete heart block (HCC)   Controlled type 2 diabetes mellitus without complication, without long-term current use of insulin (HCC)   Essential hypertension   Dizziness/ Fatigue: Attribute to her bradycardia and heart block.  We will evaluate for thyroid and lyme for reversible third degree heart block.  Cardiology on board  Heart block: Lyme serology/ pacer pads on patient.  Hold metoprolol and antihypertensives. Cardiology on board.    DM II; SSI/ glycemic protocol.  HTN: Pt to be hypertensive to monitor effect on heart rate   DVT prophylaxis:  Heparin  Code Status:  Full code  Family Communication:  Valone,Lacy (Spouse)  [phone] (Home Phone)   Disposition Plan:   Home  Consults called:  Cardiology consult-Dr. Gwen Pounds  Admission status: Inpatient   Gertha Calkin MD [facility] [phone] How to contact the [facility] Attending or Consulting provider 7A - 7P or covering provider during after hours 7P -7A, for this patient.    Check the care team in [facility] and look for a) attending/consulting [facility] provider listed and b) the [facility] team listed Log into [email or web address] and use [place]'s universal password to access. If you do not have the password, please contact the hospital operator. Locate the [facility] provider you are looking for under [facility] and page to a number that you can be directly reached. If you still have difficulty reaching the provider, please page the [facility] (Director on Call) for the Hospitalists listed on amion for assistance. [email or web address] Password [facility] 05/30/2021, 8:43 PM

## 2021-05-30 NOTE — ED Triage Notes (Signed)
Pt sent from PCP for slow heart rate. Pt states she is more tired lately and feeling dizzy more often. Pt alert, NAD at this time.

## 2021-05-30 NOTE — ED Notes (Signed)
Patient's bp is elevated, checked 2x manually. MD notified.

## 2021-05-30 NOTE — Consult Note (Signed)
[facility] Cardiology Consultation Note  Patient ID: Sherri Jenkins, MRN: [id number], DOB/AGE: [birth year] 65 y.o. Admit date: 05/30/2021   Date of Consult: 05/30/2021 Primary Physician: Laurine Blazer, MD Primary Cardiologist: None  Chief Complaint:  Chief Complaint  Patient presents with   Bradycardia   Reason for Consult:  Heart block  HPI: 65 y.o. female with known hypertension hyperlipidemia peripheral vascular disease diabetes and previous stroke.  The patient previously has been on appropriate medication management for hypertension including metoprolol and hydrochlorothiazide.  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  The patient also has had high intensity cholesterol therapy with rosuvastatin.  Recently she has had significant amount of weakness and fatigue and unable to walk or do the things she wishes to do and has had some severe dizziness.  When seen in the emergency room she had an EKG showing complete heart block with ventricular escape at 25 to 30 bpm but hemodynamically stable with no evidence of symptoms at rest and .  There is been no evidence of chest pain or congestive heart failure.  Currently she is hemodynamically stable  Past Medical History:  Diagnosis Date   Diabetes mellitus without complication (HCC)    Hypertension    Stroke [facility])       Surgical History: History reviewed. No pertinent surgical history.   Home Meds: Prior to Admission medications   Medication Sig Start Date End Date Taking? Authorizing Provider  rosuvastatin (CRESTOR) 5 MG tablet Take 2.5 mg by mouth 3 (three) times a week. Monday, Wednesday and Friday 02/18/21 02/18/22 Yes [provider]  donepezil (ARICEPT) 10 MG tablet Take 10 mg by mouth at bedtime. 03/07/21   [provider]  ELIQUIS 5 MG TABS tablet Take 5 mg by mouth 2 (two) times daily. 05/01/21   [provider]  hydrochlorothiazide (HYDRODIURIL) 25 MG tablet Take 25 mg by mouth daily.    [provider]  losartan (COZAAR) 25 MG tablet Take 25 mg by mouth daily.    [provider]  metFORMIN (GLUCOPHAGE) 500 MG tablet Take by mouth 2 (two) times daily with a meal.    [provider]  metoprolol succinate (TOPROL-XL) 50 MG 24 hr tablet Take 50 mg by mouth daily. 03/27/21   [provider]  PARoxetine (PAXIL) 40 MG tablet Take 40 mg by mouth every morning.    [provider]    Inpatient Medications:     Allergies:  Allergies  Allergen Reactions   Atorvastatin    Penicillins Hives   Sulfa Antibiotics Rash    Social History   Socioeconomic History   Marital status: Married    Spouse name: Not on file   Number of children: Not on file   Years of education: Not on file   Highest education level: Not on file  Occupational History   Not on file  Tobacco Use   Smoking status: Never   Smokeless tobacco: Never  Substance and Sexual Activity   Alcohol use: No   Drug use: Never   Sexual activity: Not on file  Other Topics Concern   Not on file  Social History Narrative   Not on file   Social Determinants of Health   Financial Resource Strain: Not on file  Food Insecurity: Not on file  Transportation Needs: Not on file  Physical Activity: Not on file  Stress: Not  on file  Social Connections: Not on file  Intimate Partner Violence: Not on file     Family History  Problem Relation Age of Onset   Breast cancer Sister 81   Colon cancer Cousin        maternal     Review of Systems Positive for weakness Negative for: General:  chills, fever, night sweats or weight changes.  Cardiovascular: PND orthopnea syncope dizziness  Dermatological skin lesions rashes Respiratory: Cough congestion Urologic: Frequent urination urination at night and hematuria Abdominal: negative for nausea, vomiting, diarrhea, bright red blood per  rectum, melena, or hematemesis Neurologic: negative for visual changes, and/or hearing changes  All other systems reviewed and are otherwise negative except as noted above.  Labs: No results for input(s): CKTOTAL, CKMB, TROPONINI in the last 72 hours. Lab Results  Component Value Date   WBC 8.7 05/30/2021   HGB 14.2 05/30/2021   HCT 43.2 05/30/2021   MCV 90.4 05/30/2021   PLT 290 05/30/2021    Recent Labs  Lab 05/30/21 1537  NA 136  K 3.7  CL 101  CO2 26  BUN 26*  CREATININE 1.00  CALCIUM 9.9  GLUCOSE 126*   No results found for: CHOL, HDL, LDLCALC, TRIG No results found for: DDIMER  Radiology/Studies:  No results found.  EKG: Normal sinus rhythm with complete heart block and ventricular escape  Weights: Filed Weights   05/30/21 1540  Weight: 65.8 kg     Physical Exam: Blood pressure 100/60, pulse (!) 27, temperature 98 F (36.7 C), temperature source Oral, resp. rate 17, height 5' (1.524 m), weight 65.8 kg, SpO2 96 %. Body mass index is 28.32 kg/m. General: Well developed, well nourished, in no acute distress. Head eyes ears nose throat: Normocephalic, atraumatic, sclera non-icteric, no xanthomas, nares are without discharge. No apparent thyromegaly and/or mass  Lungs: Normal respiratory effort.  no wheezes, no rales, no rhonchi.  Heart: RRR with normal S1 S2. no murmur gallop, no rub, PMI is normal size and placement, carotid upstroke normal without bruit, jugular venous pressure is normal Abdomen: Soft, non-tender, non-distended with normoactive bowel sounds. No hepatomegaly. No rebound/guarding. No obvious abdominal masses. Abdominal aorta is normal size without bruit Extremities: No edema. no cyanosis, no clubbing, no ulcers  Peripheral : 2+ bilateral upper extremity pulses, 2+ bilateral femoral pulses, 2+ bilateral dorsal pedal pulse Neuro: Alert and oriented. No facial asymmetry. No focal deficit. Moves all extremities spontaneously. Musculoskeletal: Normal  muscle tone without kyphosis Psych:  Responds to questions appropriately with a normal affect.    Assessment: 65 year old female with hypertension hyperlipidemia diabetes and previous cerebrovascular issues having acute complete heart block likely secondary to medication management needing further treatment options without evidence of congestive heart failure or myocardial infarction  Plan: 1.  Abstain from any beta-blocker or antihypertensive medication management due to concerns for hypotension and/or bradycardia and/or heart block following closely for washout and potentially improvements.  Have discussed the possibility of temporary pacemaker placement as well as the possibility of permanent pacemaker placement if no resolution occurs 2.  Abstain from Eliquis at this time for further risk reduction and possibility of above procedures and risk reduction in bleeding complications 3.  Further treatment options after above  Signed, Lamar Blinks M.D. [facility] Cardiology 05/30/2021, 6:34 PM

## 2021-05-31 ENCOUNTER — Inpatient Hospital Stay
Admit: 2021-05-31 | Discharge: 2021-05-31 | Disposition: A | Discharge status: Home / Self Care | Payer: Medicare Other | Attending: Internal Medicine | Admitting: Internal Medicine

## 2021-05-31 LAB — CBC
HCT: 38.4 % (ref 36.0–46.0)
Hemoglobin: 12.8 g/dL (ref 12.0–15.0)
MCH: 29.9 pg (ref 26.0–34.0)
MCHC: 33.3 g/dL (ref 30.0–36.0)
MCV: 89.7 fL (ref 80.0–100.0)
Platelets: 249 10*3/uL (ref 150–400)
RBC: 4.28 MIL/uL (ref 3.87–5.11)
RDW: 13.4 % (ref 11.5–15.5)
WBC: 7.7 10*3/uL (ref 4.0–10.5)
nRBC: 0 % (ref 0.0–0.2)

## 2021-05-31 LAB — ECHOCARDIOGRAM COMPLETE
AR max vel: 1.11 cm2
AV Area VTI: 1.06 cm2
AV Area mean vel: 1.21 cm2
AV Mean grad: 5 mmHg
AV Peak grad: 10.9 mmHg
Ao pk vel: 1.65 m/s
Area-P 1/2: 4.97 cm2
Height: 60 in
MV VTI: 1.35 cm2
S' Lateral: 3 cm
Weight: 2320 oz

## 2021-05-31 LAB — FERRITIN: Ferritin: 41 ng/mL (ref 11–307)

## 2021-05-31 LAB — MAGNESIUM: Magnesium: 2.1 mg/dL (ref 1.7–2.4)

## 2021-05-31 LAB — POTASSIUM: Potassium: 3.8 mmol/L (ref 3.5–5.1)

## 2021-05-31 LAB — HIV ANTIBODY (ROUTINE TESTING W REFLEX): HIV Screen 4th Generation wRfx: NONREACTIVE

## 2021-05-31 LAB — PHOSPHORUS: Phosphorus: 3.6 mg/dL (ref 2.5–4.6)

## 2021-05-31 NOTE — ED Notes (Signed)
Pt given drink and apple sauce 

## 2021-05-31 NOTE — ED Notes (Signed)
Pt given drink and meal tray at this time

## 2021-05-31 NOTE — Progress Notes (Signed)
*  PRELIMINARY RESULTS* Echocardiogram 2D Echocardiogram has been performed.  Sherri Jenkins Sherri Jenkins 05/31/2021, 11:55 AM

## 2021-05-31 NOTE — ED Notes (Signed)
MD messaged to get update on POC for this pt and plan for pacemaker placement.

## 2021-05-31 NOTE — ED Notes (Signed)
Pt provided TV remote

## 2021-05-31 NOTE — Progress Notes (Signed)
1       [facility]   PATIENT NAME: Sherri Jenkins    MR#:  [id number]  DATE OF BIRTH:  [birth year]  SUBJECTIVE:  CHIEF COMPLAINT:   Chief Complaint  Patient presents with   Bradycardia  Patient denies any symptoms at this time.  Heart rate on monitor shows anywhere from 24-28 while I am in the room.  Husband at bedside REVIEW OF SYSTEMS:  Review of Systems  Constitutional:  Negative for diaphoresis, fever, malaise/fatigue and weight loss.  HENT:  Negative for ear discharge, ear pain, hearing loss, nosebleeds, sore throat and tinnitus.   Eyes:  Negative for blurred vision and pain.  Respiratory:  Negative for cough, hemoptysis, shortness of breath and wheezing.   Cardiovascular:  Negative for chest pain, palpitations, orthopnea and leg swelling.  Gastrointestinal:  Negative for abdominal pain, blood in stool, constipation, diarrhea, heartburn, nausea and vomiting.  Genitourinary:  Negative for dysuria, frequency and urgency.  Musculoskeletal:  Negative for back pain and myalgias.  Skin:  Negative for itching and rash.  Neurological:  Positive for dizziness. Negative for tingling, tremors, focal weakness, seizures, weakness and headaches.  Psychiatric/Behavioral:  Negative for depression. The patient is not nervous/anxious.   DRUG ALLERGIES:   Allergies  Allergen Reactions   Atorvastatin    Penicillins Hives   Sulfa Antibiotics Rash   VITALS:  Blood pressure (!) 158/48, pulse (!) 30, temperature 98 F (36.7 C), temperature source Oral, resp. rate 17, height 5' (1.524 m), weight 65.8 kg, SpO2 100 %. PHYSICAL EXAMINATION:  Physical Exam 65 year old female lying in the bed comfortably without any acute distress Lungs clear to auscultation bilaterally no wheezing rales rhonchi or crepitation Cardiovascular S1-S2 normal bradycardic no murmur rales or gallop Abdomen soft benign Neuro alert and oriented nonfocal exam Skin no rash or lesion Psych normal mood and  affect LABORATORY PANEL:  Female CBC Recent Labs  Lab 05/31/21 0622  WBC 7.7  HGB 12.8  HCT 38.4  PLT 249   ------------------------------------------------------------------------------------------------------------------ Chemistries  Recent Labs  Lab 05/30/21 1537 05/30/21 2333  NA 136  --   K 3.7 3.8  CL 101  --   CO2 26  --   GLUCOSE 126*  --   BUN 26*  --   CREATININE 1.00  --   CALCIUM 9.9  --   MG  --  2.1   RADIOLOGY:  ECHOCARDIOGRAM COMPLETE  Result Date: 05/31/2021    ECHOCARDIOGRAM REPORT   Patient Name:   Sherri Jenkins Date of Exam: 05/31/2021 Medical Rec #:  [id number]    Height:       60.0 in Accession #:    [id number]   Weight:       145.0 lb Date of Birth:  1956-11-19    BSA:          1.628 m Patient Age:    65 years     BP:           162/58 mmHg Patient Gender: F            HR:           25 bpm. Exam Location:  [facility] Procedure: 2D Echo, Color Doppler and Cardiac Doppler Indications:     Bradycardia; Heart block AV third degree  History:         Patient has no prior history of Echocardiogram examinations.  Stroke; Risk Factors:Hypertension and Diabetes.  Sonographer:     Humphrey Rolls RDCS (AE) Referring Phys:  [id number] Gertha Calkin Diagnosing Phys: Arnoldo Hooker MD  Sonographer Comments: Suboptimal apical window and suboptimal subcostal window. IMPRESSIONS  1. Left ventricular ejection fraction, by estimation, is 55 to 60%. The left ventricle has normal function. The left ventricle has no regional wall motion abnormalities. Left ventricular diastolic parameters were normal.  2. Right ventricular systolic function is normal. The right ventricular size is normal.  3. The mitral valve is normal in structure. Mild mitral valve regurgitation.  4. The aortic valve is normal in structure. Aortic valve regurgitation is not visualized. FINDINGS  Left Ventricle: Left ventricular ejection fraction, by estimation, is 55 to 60%. The left ventricle has normal function. The  left ventricle has no regional wall motion abnormalities. The left ventricular internal cavity size was normal in size. There is  no left ventricular hypertrophy. Left ventricular diastolic parameters were normal. Right Ventricle: The right ventricular size is normal. No increase in right ventricular wall thickness. Right ventricular systolic function is normal. Left Atrium: Left atrial size was normal in size. Right Atrium: Right atrial size was normal in size. Pericardium: There is no evidence of pericardial effusion. Mitral Valve: The mitral valve is normal in structure. Mild mitral valve regurgitation. MV peak gradient, 5.0 mmHg. The mean mitral valve gradient is 3.0 mmHg. Tricuspid Valve: The tricuspid valve is normal in structure. Tricuspid valve regurgitation is trivial. Aortic Valve: The aortic valve is normal in structure. Aortic valve regurgitation is not visualized. Aortic valve mean gradient measures 5.0 mmHg. Aortic valve peak gradient measures 10.9 mmHg. Aortic valve area, by VTI measures 1.06 cm. Pulmonic Valve: The pulmonic valve was normal in structure. Pulmonic valve regurgitation is not visualized. Aorta: The aortic root and ascending aorta are structurally normal, with no evidence of dilitation. IAS/Shunts: No atrial level shunt detected by color flow Doppler.  LEFT VENTRICLE PLAX 2D LVIDd:         4.60 cm LVIDs:         3.00 cm LV PW:         1.10 cm LV IVS:        0.70 cm LVOT diam:     1.50 cm LV SV:         46 LV SV Index:   29 LVOT Area:     1.77 cm  RIGHT VENTRICLE RV Basal diam:  3.10 cm LEFT ATRIUM         Index LA diam:    3.90 cm 2.40 cm/m  AORTIC VALVE                    PULMONIC VALVE AV Area (Vmax):    1.11 cm     PV Vmax:       1.51 m/s AV Area (Vmean):   1.21 cm     PV Vmean:      94.400 cm/s AV Area (VTI):     1.06 cm     PV VTI:        0.297 m AV Vmax:           165.00 cm/s  PV Peak grad:  9.1 mmHg AV Vmean:          101.000 cm/s PV Mean grad:  4.0 mmHg AV VTI:             0.440 m AV Peak Grad:      10.9 mmHg AV Mean  Grad:      5.0 mmHg LVOT Vmax:         104.00 cm/s LVOT Vmean:        69.100 cm/s LVOT VTI:          0.263 m LVOT/AV VTI ratio: 0.60  AORTA Ao Root diam: 2.60 cm MITRAL VALVE MV Area (PHT): 4.97 cm     SHUNTS MV Area VTI:   1.35 cm     Systemic VTI:  0.26 m MV Peak grad:  5.0 mmHg     Systemic Diam: 1.50 cm MV Mean grad:  3.0 mmHg MV Vmax:       1.12 m/s MV Vmean:      84.2 cm/s MV Decel Time: 153 msec MV E velocity: 133.00 cm/s MV A velocity: 97.85 cm/s MV E/A ratio:  1.36 Arnoldo Hooker MD Electronically signed by Arnoldo Hooker MD Signature Date/Time: 05/31/2021/3:31:43 PM    Final    ASSESSMENT AND PLAN:  65 year old female with a known history of hypertension, hyperlipidemia, PVD, diabetes and history of stroke is admitted for complete heart block with symptoms of fatigue and dizziness  Complete heart block Seen and evaluated by cardiology Dr. Arnoldo Hooker He recommends close monitoring on telemetry/PCU and washout of beta-blocker Avoid any rate controlling agents.  No Eliquis. Plan for permanent pacemaker on Monday 6/13 Patient's resting heart rate is in 20s to 30s but she is asymptomatic  Keep the pacer pads at bedside Echo shows normal LV systolic function  Diabetes mellitus Sliding scale insulin for now  Hyperlipidemia On Crestor   Body mass index is 28.32 kg/m.  Net IO Since Admission: 447.26 mL [05/31/21 1731]      Status is: Inpatient  Remains inpatient appropriate because:Ongoing diagnostic testing needed not appropriate for outpatient work up  Dispo: The patient is from: Home              Anticipated d/c is to: Home              Patient currently is not medically stable to d/c.   Difficult to place patient No   She remains critically sick with complete heart block.  Cardiology does not think anything else needs to be done over the weekend and and recommends waiting for permanent pacemaker on Monday afternoon.  Patient  and family along with nursing are well aware of this.  Patient has been waiting in the ER for hospital bed and there are none available on PCU or ICU/stepdown    DVT prophylaxis:       SCDs Start: 05/30/21 1838     Family Communication: Updated husband at bedside on 6/10   All the records are reviewed and case discussed with Care Management/Social Worker. Management plans discussed with the patient, family and they are in agreement.  CODE STATUS: Full Code Level of care: Progressive Cardiac  TOTAL TIME TAKING CARE OF THIS PATIENT: 35 minutes.   More than 50% of the time was spent in counseling/coordination of care: YES  POSSIBLE D/C IN 3-4 DAYS, DEPENDING ON CLINICAL CONDITION.   Delfino Lovett M.D on 05/31/2021 at 5:31 PM  [facility]   CC: Primary care physician; Laurine Blazer, MD  Note: This dictation was prepared with Dragon dictation along with smaller phrase technology. Any transcriptional errors that result from this process are unintentional.

## 2021-05-31 NOTE — Progress Notes (Signed)
SUBJECTIVE: Patient overall feels well today with no evidence of significant congestive heart failure or anginal symptoms.  Overall EKG and telemetry continues to show complete heart block despite the leading beta-blocker washout.  Currently it appears that the patient may need pacemaker placement for treatment.  We have discussed at length the possibility of pacemaker placement although somewhat concerned about Eliquis and bleeding complications as well.  Vitals:   05/31/21 0645 05/31/21 0700 05/31/21 0725 05/31/21 0730  BP:   (!) 138/58 140/60  Pulse: (!) 28   (!) 33  Resp: 13 17 16 16   Temp:      TempSrc:      SpO2: 98%  96% 96%  Weight:      Height:        Intake/Output Summary (Last 24 hours) at 05/31/2021 0734 Last data filed at 05/31/2021 0431 Gross per 24 hour  Intake 447.26 ml  Output --  Net 447.26 ml    LABS: Basic Metabolic Panel: Recent Labs    05/30/21 1537 05/30/21 2333  NA 136  --   K 3.7 3.8  CL 101  --   CO2 26  --   GLUCOSE 126*  --   BUN 26*  --   CREATININE 1.00  --   CALCIUM 9.9  --   MG  --  2.1  PHOS  --  3.6   Liver Function Tests: No results for input(s): AST, ALT, ALKPHOS, BILITOT, PROT, ALBUMIN in the last 72 hours. No results for input(s): LIPASE, AMYLASE in the last 72 hours. CBC: Recent Labs    05/30/21 1537 05/31/21 0622  WBC 8.7 7.7  NEUTROABS 5.3  --   HGB 14.2 12.8  HCT 43.2 38.4  MCV 90.4 89.7  PLT 290 249   Cardiac Enzymes: No results for input(s): CKTOTAL, CKMB, CKMBINDEX, TROPONINI in the last 72 hours. BNP: Invalid input(s): POCBNP D-Dimer: No results for input(s): DDIMER in the last 72 hours. Hemoglobin A1C: No results for input(s): HGBA1C in the last 72 hours. Fasting Lipid Panel: No results for input(s): CHOL, HDL, LDLCALC, TRIG, CHOLHDL, LDLDIRECT in the last 72 hours. Thyroid Function Tests: Recent Labs    05/30/21 1537  TSH 1.404   Anemia Panel: Recent Labs    05/31/21 0622  FERRITIN 41      PHYSICAL EXAM General: Well developed, well nourished, in no acute distress HEENT:  Normocephalic and atramatic Neck:  No JVD.  Lungs: Clear bilaterally to auscultation and percussion. Heart: HRRR . Normal S1 and S2 without gallops or murmurs.  Abdomen: Bowel sounds are positive, abdomen soft and non-tender  Msk:  Back normal, normal gait. Normal strength and tone for age. Extremities: No clubbing, cyanosis or edema.   Neuro: Alert and oriented X 3. Psych:  Good affect, responds appropriately  TELEMETRY: Reviewed telemetry pt in complete heart block with ventricular escape of 28 bpm:  ASSESSMENT AND PLAN: 65 year old female with cerebrovascular concerns and hypertension now with complete heart block causing weakness fatigue and dizziness and presyncope not improved at this time despite washout of beta-blocker 1.  Continue observation at this time and further discussion of the possibility of Micra pacemaker placement.  Patient understands the risk and benefits of Micra pacemaker placement this includes in the possibility of death stroke heart attack Bleeding bruising rhythm disturbances and hemopericardium.  The patient is at low risk for conscious sedation 2.  Continue to abstain from Eliquis and beta-blocker for further risk reduction and side effects of above issues.  3.  Further discussion with cardiology team for approach of above and timing Principal Problem:   Dizziness, nonspecific Active Problems:   Controlled type 2 diabetes mellitus without complication, without long-term current use of insulin (HCC)   Essential hypertension   Heart block AV third degree (HCC)   Complete heart block (HCC)    Lamar Blinks, MD, [facility] 05/31/2021 7:34 AM

## 2021-06-01 LAB — FERRITIN: Ferritin: 52 ng/mL (ref 11–307)

## 2021-06-01 NOTE — Progress Notes (Signed)
   06/01/21 1258  Assess: MEWS Score  Temp 98.1 F (36.7 C)  BP (!) 159/56  Pulse Rate (!) 35  Resp 16  Level of Consciousness Alert  SpO2 99 %  O2 Device Room Air  Patient Activity (if Appropriate) In bed  Assess: MEWS Score  MEWS Temp 0  MEWS Systolic 0  MEWS Pulse 2  MEWS RR 0  MEWS LOC 0  MEWS Score 2  MEWS Score Color Yellow  Assess: if the MEWS score is Yellow or Red  Were vital signs taken at a resting state? Yes  Focused Assessment No change from prior assessment  Early Detection of Sepsis Score *See Row Information* Low  MEWS guidelines implemented *See Row Information* Yes  Treat  MEWS Interventions Escalated (See documentation below)  Pain Scale 0-10  Pain Score 0  Take Vital Signs  Increase Vital Sign Frequency  Yellow: Q 2hr X 2 then Q 4hr X 2, if remains yellow, continue Q 4hrs  Escalate  MEWS: Escalate Yellow: discuss with charge nurse/RN and consider discussing with provider and RRT  Notify: Charge Nurse/RN  Name of Charge Nurse/RN Notified Judeth Cornfield  Date Charge Nurse/RN Notified 06/01/21  Time Charge Nurse/RN Notified 1300  Document  Patient Outcome Stabilized after interventions  Progress note created (see row info) Yes

## 2021-06-01 NOTE — ED Notes (Signed)
Hospital bed requested for patient comfort.

## 2021-06-01 NOTE — Plan of Care (Signed)
  Problem: Education: Goal: Knowledge of General Education information will improve Description: Including pain rating scale, medication(s)/side effects and non-pharmacologic comfort measures 06/01/2021 1344 by Ansel Bong, RN Outcome: Progressing 06/01/2021 1344 by Ansel Bong, RN Outcome: Progressing   Problem: Health Behavior/Discharge Planning: Goal: Ability to manage health-related needs will improve 06/01/2021 1344 by Ansel Bong, RN Outcome: Progressing 06/01/2021 1344 by Ansel Bong, RN Outcome: Progressing   Problem: Clinical Measurements: Goal: Ability to maintain clinical measurements within normal limits will improve 06/01/2021 1344 by Ansel Bong, RN Outcome: Progressing 06/01/2021 1344 by Ansel Bong, RN Outcome: Progressing Goal: Will remain free from infection 06/01/2021 1344 by Ansel Bong, RN Outcome: Progressing 06/01/2021 1344 by Ansel Bong, RN Outcome: Progressing Goal: Diagnostic test results will improve 06/01/2021 1344 by Ansel Bong, RN Outcome: Progressing 06/01/2021 1344 by Ansel Bong, RN Outcome: Progressing Goal: Respiratory complications will improve 06/01/2021 1344 by Ansel Bong, RN Outcome: Progressing 06/01/2021 1344 by Ansel Bong, RN Outcome: Progressing Goal: Cardiovascular complication will be avoided 06/01/2021 1344 by Ansel Bong, RN Outcome: Progressing 06/01/2021 1344 by Ansel Bong, RN Outcome: Progressing   Problem: Activity: Goal: Risk for activity intolerance will decrease 06/01/2021 1344 by Ansel Bong, RN Outcome: Progressing 06/01/2021 1344 by Ansel Bong, RN Outcome: Progressing   Problem: Nutrition: Goal: Adequate nutrition will be maintained 06/01/2021 1344 by Ansel Bong, RN Outcome: Progressing 06/01/2021 1344 by Ansel Bong, RN Outcome: Progressing   Problem: Coping: Goal: Level of anxiety will decrease 06/01/2021 1344 by Ansel Bong, RN Outcome:  Progressing 06/01/2021 1344 by Ansel Bong, RN Outcome: Progressing   Problem: Elimination: Goal: Will not experience complications related to bowel motility 06/01/2021 1344 by Ansel Bong, RN Outcome: Progressing 06/01/2021 1344 by Ansel Bong, RN Outcome: Progressing Goal: Will not experience complications related to urinary retention 06/01/2021 1344 by Ansel Bong, RN Outcome: Progressing 06/01/2021 1344 by Ansel Bong, RN Outcome: Progressing   Problem: Pain Managment: Goal: General experience of comfort will improve 06/01/2021 1344 by Ansel Bong, RN Outcome: Progressing 06/01/2021 1344 by Ansel Bong, RN Outcome: Progressing   Problem: Safety: Goal: Ability to remain free from injury will improve 06/01/2021 1344 by Ansel Bong, RN Outcome: Progressing 06/01/2021 1344 by Ansel Bong, RN Outcome: Progressing   Problem: Skin Integrity: Goal: Risk for impaired skin integrity will decrease 06/01/2021 1344 by Ansel Bong, RN Outcome: Progressing 06/01/2021 1344 by Ansel Bong, RN Outcome: Progressing

## 2021-06-01 NOTE — Progress Notes (Signed)
SUBJECTIVE: Patient overall feels well today with no evidence of significant congestive heart failure or anginal symptoms.  Overall EKG and telemetry continues to show complete heart block despite   beta-blocker washout.  Currently it appears that the patient may need pacemaker placement for treatment.  We have discussed at length the possibility of pacemaker placement although somewhat concerned about Eliquis and bleeding complications as well.  Patient is hemodynamically stable  Vitals:   06/01/21 0245 06/01/21 0349 06/01/21 0543 06/01/21 0645  BP:  (!) 150/32 (!) 158/41   Pulse: (!) 30 (!) 32 (!) 31 (!) 30  Resp: 19 16 16 18   Temp:      TempSrc:      SpO2: 96% 94% 96% 94%  Weight:      Height:       No intake or output data in the 24 hours ending 06/01/21 0708   LABS: Basic Metabolic Panel: Recent Labs    05/30/21 1537 05/30/21 2333  NA 136  --   K 3.7 3.8  CL 101  --   CO2 26  --   GLUCOSE 126*  --   BUN 26*  --   CREATININE 1.00  --   CALCIUM 9.9  --   MG  --  2.1  PHOS  --  3.6    Liver Function Tests: No results for input(s): AST, ALT, ALKPHOS, BILITOT, PROT, ALBUMIN in the last 72 hours. No results for input(s): LIPASE, AMYLASE in the last 72 hours. CBC: Recent Labs    05/30/21 1537 05/31/21 0622  WBC 8.7 7.7  NEUTROABS 5.3  --   HGB 14.2 12.8  HCT 43.2 38.4  MCV 90.4 89.7  PLT 290 249    Cardiac Enzymes: No results for input(s): CKTOTAL, CKMB, CKMBINDEX, TROPONINI in the last 72 hours. BNP: Invalid input(s): POCBNP D-Dimer: No results for input(s): DDIMER in the last 72 hours. Hemoglobin A1C: No results for input(s): HGBA1C in the last 72 hours. Fasting Lipid Panel: No results for input(s): CHOL, HDL, LDLCALC, TRIG, CHOLHDL, LDLDIRECT in the last 72 hours. Thyroid Function Tests: Recent Labs    05/30/21 1537  TSH 1.404    Anemia Panel: Recent Labs    06/01/21 0543  FERRITIN 52      PHYSICAL EXAM General: Well developed, well  nourished, in no acute distress HEENT:  Normocephalic and atramatic Neck:  No JVD.  Lungs: Clear bilaterally to auscultation and percussion. Heart: HRRR . Normal S1 and S2 without gallops or murmurs.  Abdomen: Bowel sounds are positive, abdomen soft and non-tender  Msk:  Back normal, normal gait. Normal strength and tone for age. Extremities: No clubbing, cyanosis or edema.   Neuro: Alert and oriented X 3. Psych:  Good affect, responds appropriately  TELEMETRY: Reviewed telemetry pt in complete heart block with ventricular escape of 28 bpm:  ASSESSMENT AND PLAN: 65 year old female with cerebrovascular concerns and hypertension now with complete heart block causing weakness fatigue and dizziness and presyncope not improved at this time despite washout of beta-blocker 1.  Continue observation at this time and further discussion of the possibility of Micra pacemaker placement.  Patient understands the risk and benefits of Micra pacemaker placement this includes in the possibility of death stroke heart attack Bleeding bruising rhythm disturbances and hemopericardium.  The patient is at low risk for conscious sedation 2.  Continue to abstain from Eliquis and beta-blocker for further risk reduction and side effects of above issues. 3.  Plan for Micra pacemaker placement  as long as patient has no further significant symptoms at this time for Monday.  Further discussion of the possibility of a temporary pacemaker as well if necessary although currently still hemodynamically stable with no particular need Principal Problem:   Dizziness, nonspecific Active Problems:   Controlled type 2 diabetes mellitus without complication, without long-term current use of insulin (HCC)   Essential hypertension   Heart block AV third degree (HCC)   Complete heart block (HCC)    Lamar Blinks, MD, [facility] 06/01/2021 7:08 AM

## 2021-06-01 NOTE — ED Notes (Signed)
Patient provided with menu and dining services phone number.

## 2021-06-01 NOTE — Progress Notes (Signed)
[place] [facility] [place] NAME: Sherri Jenkins    MR#:  [id number]  DATE OF BIRTH:  Feb 27, 1956  SUBJECTIVE:  CHIEF COMPLAINT:   Chief Complaint  Patient presents with   Bradycardia  Patient requesting dietary needs are met (gluten-free), no apple juice as she has allergy.  Did not seem too happy as her dietary request were not honored from cafeteria REVIEW OF SYSTEMS:  Review of Systems  Constitutional:  Negative for diaphoresis, fever, malaise/fatigue and weight loss.  HENT:  Negative for ear discharge, ear pain, hearing loss, nosebleeds, sore throat and tinnitus.   Eyes:  Negative for blurred vision and pain.  Respiratory:  Negative for cough, hemoptysis, shortness of breath and wheezing.   Cardiovascular:  Negative for chest pain, palpitations, orthopnea and leg swelling.  Gastrointestinal:  Negative for abdominal pain, blood in stool, constipation, diarrhea, heartburn, nausea and vomiting.  Genitourinary:  Negative for dysuria, frequency and urgency.  Musculoskeletal:  Negative for back pain and myalgias.  Skin:  Negative for itching and rash.  Neurological:  Positive for dizziness. Negative for tingling, tremors, focal weakness, seizures, weakness and headaches.  Psychiatric/Behavioral:  Negative for depression. The patient is not nervous/anxious.   DRUG ALLERGIES:   Allergies  Allergen Reactions   Wheat Bran Diarrhea   Atorvastatin    Corn-Containing Products Diarrhea   Penicillins Hives   Sulfa Antibiotics Rash   VITALS:  Blood pressure (!) 159/56, pulse (!) 35, temperature 98.1 F (36.7 C), resp. rate 16, height 5' (1.524 m), weight 63 kg, SpO2 99 %. PHYSICAL EXAMINATION:  Physical Exam 65 year old female lying in the bed comfortably without any acute distress Lungs clear to auscultation bilaterally no wheezing rales rhonchi or crepitation Cardiovascular S1-S2 normal bradycardic no murmur rales or gallop Abdomen soft benign Neuro alert and  oriented nonfocal exam Skin no rash or lesion Psych normal mood and affect LABORATORY PANEL:  Female CBC Recent Labs  Lab 05/31/21 0622  WBC 7.7  HGB 12.8  HCT 38.4  PLT 249    ------------------------------------------------------------------------------------------------------------------ Chemistries  Recent Labs  Lab 05/30/21 1537 05/30/21 2333  NA 136  --   K 3.7 3.8  CL 101  --   CO2 26  --   GLUCOSE 126*  --   BUN 26*  --   CREATININE 1.00  --   CALCIUM 9.9  --   MG  --  2.1    RADIOLOGY:  No results found. ASSESSMENT AND PLAN:  65 year old female with a known history of hypertension, hyperlipidemia, PVD, diabetes and history of stroke is admitted for complete heart block with symptoms of fatigue and dizziness  Complete heart block cardiology Dr. Serafina Royals following - close monitoring on telemetry/PCU and washout of beta-blocker Avoid any rate controlling agents.  No Eliquis. Plan for permanent pacemaker on Monday 6/13 Patient's resting heart rate is in 20s to 30s but she is asymptomatic  Keep the pacer pads at bedside Echo shows normal LV systolic function  Diabetes mellitus Sliding scale insulin for now  Hyperlipidemia On Crestor   Body mass index is 27.11 kg/m.  Net IO Since Admission: 447.26 mL [06/01/21 1404]      Status is: Inpatient  Remains inpatient appropriate because:Ongoing diagnostic testing needed not appropriate for outpatient work up  Dispo: The patient is from: Home              Anticipated d/c is to: Home  Patient currently is not medically stable to d/c.   Difficult to place patient No   She remains critically sick with complete heart block.  Cardiology does not think anything else needs to be done over the weekend and and recommends waiting for permanent pacemaker on Monday afternoon.  Patient and family along with nursing are well aware of this.     DVT prophylaxis:       SCDs Start: 05/30/21 1838      Family Communication: Updated husband at bedside on 6/10   All the records are reviewed and case discussed with Care Management/Social Worker. Management plans discussed with the patient, nursing and they are in agreement.  CODE STATUS: Full Code Level of care: Progressive Cardiac  TOTAL TIME TAKING CARE OF THIS PATIENT: 35 minutes.   More than 50% of the time was spent in counseling/coordination of care: YES  POSSIBLE D/C IN 2-3 DAYS, DEPENDING ON CLINICAL CONDITION.  And pacemaker placement   Max Sane M.D on 06/01/2021 at 2:04 PM  [facility]   CC: Primary care physician; Loraine Grip, MD  Note: This dictation was prepared with Dragon dictation along with smaller phrase technology. Any transcriptional errors that result from this process are unintentional.

## 2021-06-02 LAB — FERRITIN: Ferritin: 59 ng/mL (ref 11–307)

## 2021-06-02 MED ORDER — GUAIFENESIN-DM 100-10 MG/5ML PO SYRP
5.0000 mL | ORAL_SOLUTION | ORAL | Status: DC | PRN
  Administered 2021-06-02 – 2021-06-04 (×2): 5 mL via ORAL
  Filled 2021-06-02 (×2): qty 5

## 2021-06-02 NOTE — Progress Notes (Signed)
1       [place] [facility]   PATIENT NAME: Sherri Jenkins    MR#:  [id number]  DATE OF BIRTH:  August 26, 1956  SUBJECTIVE:  CHIEF COMPLAINT:   Chief Complaint  Patient presents with   Bradycardia  Sleepy, reports some cough and congestion REVIEW OF SYSTEMS:  Review of Systems  Constitutional:  Negative for diaphoresis, fever, malaise/fatigue and weight loss.  HENT:  Negative for ear discharge, ear pain, hearing loss, nosebleeds, sore throat and tinnitus.   Eyes:  Negative for blurred vision and pain.  Respiratory:  Positive for cough. Negative for hemoptysis, shortness of breath and wheezing.   Cardiovascular:  Negative for chest pain, palpitations, orthopnea and leg swelling.  Gastrointestinal:  Negative for abdominal pain, blood in stool, constipation, diarrhea, heartburn, nausea and vomiting.  Genitourinary:  Negative for dysuria, frequency and urgency.  Musculoskeletal:  Negative for back pain and myalgias.  Skin:  Negative for itching and rash.  Neurological:  Positive for dizziness. Negative for tingling, tremors, focal weakness, seizures, weakness and headaches.  Psychiatric/Behavioral:  Negative for depression. The patient is not nervous/anxious.   DRUG ALLERGIES:   Allergies  Allergen Reactions   Wheat Bran Diarrhea   Atorvastatin    Corn-Containing Products Diarrhea   Penicillins Hives   Sulfa Antibiotics Rash   VITALS:  Blood pressure (!) 163/45, pulse (!) 36, temperature 97.8 F (36.6 C), temperature source Oral, resp. rate 18, height 5' (1.524 m), weight 62.9 kg, SpO2 93 %. PHYSICAL EXAMINATION:  Physical Exam 65 year old female lying in the bed comfortably without any acute distress Lungs clear to auscultation bilaterally no wheezing rales rhonchi or crepitation Cardiovascular S1-S2 normal bradycardic no murmur rales or gallop Abdomen soft benign Neuro alert and oriented nonfocal exam Skin no rash or lesion Psych normal mood and affect LABORATORY  PANEL:  Female CBC Recent Labs  Lab 05/31/21 0622  WBC 7.7  HGB 12.8  HCT 38.4  PLT 249    ------------------------------------------------------------------------------------------------------------------ Chemistries  Recent Labs  Lab 05/30/21 1537 05/30/21 2333  NA 136  --   K 3.7 3.8  CL 101  --   CO2 26  --   GLUCOSE 126*  --   BUN 26*  --   CREATININE 1.00  --   CALCIUM 9.9  --   MG  --  2.1    RADIOLOGY:  No results found. ASSESSMENT AND PLAN:  65 year old female with a known history of hypertension, hyperlipidemia, PVD, diabetes and history of stroke is admitted for complete heart block with symptoms of fatigue and dizziness  Complete heart block cardiology Dr. Arnoldo Hooker following Avoid any rate controlling agents.  No Eliquis. Plan for permanent pacemaker on Monday 6/13 Patient's resting heart rate is in 20s to 30s but she is asymptomatic  Keep the pacer pads at bedside Echo shows normal LV systolic function  Diabetes mellitus Sliding scale insulin for now  Hyperlipidemia On Crestor  Cough/congestion Robitussin as needed   Body mass index is 27.08 kg/m.  Net IO Since Admission: -772.74 mL [06/02/21 1257]      Status is: Inpatient  Remains inpatient appropriate because:Ongoing diagnostic testing needed not appropriate for outpatient work up  Dispo: The patient is from: Home              Anticipated d/c is to: Home              Patient currently is not medically stable to d/c.   Difficult to place patient  No     DVT prophylaxis:       SCDs Start: 05/30/21 1838     Family Communication: Updated husband at bedside on 6/10   All the records are reviewed and case discussed with Care Management/Social Worker. Management plans discussed with the patient, nursing and they are in agreement.  CODE STATUS: Full Code Level of care: Progressive Cardiac  TOTAL TIME TAKING CARE OF THIS PATIENT: 35 minutes.   More than 50% of the time  was spent in counseling/coordination of care: YES  POSSIBLE D/C IN 1-2 DAYS, DEPENDING ON CLINICAL CONDITION.  And pacemaker placement   Delfino Lovett M.D on 06/02/2021 at 12:57 PM  [facility]   CC: Primary care physician; Laurine Blazer, MD  Note: This dictation was prepared with Dragon dictation along with smaller phrase technology. Any transcriptional errors that result from this process are unintentional.

## 2021-06-02 NOTE — Plan of Care (Signed)
  Problem: Education: Goal: Knowledge of General Education information will improve Description: Including pain rating scale, medication(s)/side effects and non-pharmacologic comfort measures 06/02/2021 1848 by Montarius Kitagawa, RN Outcome: Progressing 06/02/2021 1848 by Danice Dippolito, RN Outcome: Progressing   Problem: Health Behavior/Discharge Planning: Goal: Ability to manage health-related needs will improve 06/02/2021 1848 by Donivan Thammavong, RN Outcome: Progressing 06/02/2021 1848 by Zyheir Daft, RN Outcome: Progressing   Problem: Clinical Measurements: Goal: Ability to maintain clinical measurements within normal limits will improve 06/02/2021 1848 by Jatasia Gundrum, RN Outcome: Progressing 06/02/2021 1848 by Felisha Claytor, RN Outcome: Progressing Goal: Will remain free from infection 06/02/2021 1848 by Timmy Cleverly, RN Outcome: Progressing 06/02/2021 1848 by Tiffanni Scarfo, RN Outcome: Progressing Goal: Diagnostic test results will improve 06/02/2021 1848 by Elania Crowl, RN Outcome: Progressing 06/02/2021 1848 by Jahmez Bily, RN Outcome: Progressing Goal: Respiratory complications will improve 06/02/2021 1848 by Jhoan Schmieder, RN Outcome: Progressing 06/02/2021 1848 by Kevin Space, RN Outcome: Progressing Goal: Cardiovascular complication will be avoided 06/02/2021 1848 by Cloyce Paterson, RN Outcome: Progressing 06/02/2021 1848 by Meegan Shanafelt, RN Outcome: Progressing   Problem: Activity: Goal: Risk for activity intolerance will decrease 06/02/2021 1848 by Calena Salem, RN Outcome: Progressing 06/02/2021 1848 by Adriene Knipfer, RN Outcome: Progressing   Problem: Nutrition: Goal: Adequate nutrition will be maintained 06/02/2021 1848 by Doneen Ollinger, RN Outcome: Progressing 06/02/2021 1848 by Sherina Stammer, RN Outcome: Progressing   Problem: Coping: Goal: Level of anxiety will decrease 06/02/2021 1848 by Haisley Arens, RN Outcome:  Progressing 06/02/2021 1848 by Lamarion Mcevers, RN Outcome: Progressing   Problem: Elimination: Goal: Will not experience complications related to bowel motility 06/02/2021 1848 by Eugine Bubb, RN Outcome: Progressing 06/02/2021 1848 by Jesselee Poth, RN Outcome: Progressing Goal: Will not experience complications related to urinary retention 06/02/2021 1848 by Marcella Dunnaway, RN Outcome: Progressing 06/02/2021 1848 by Glora Hulgan, RN Outcome: Progressing   Problem: Pain Managment: Goal: General experience of comfort will improve 06/02/2021 1848 by Larya Charpentier, RN Outcome: Progressing 06/02/2021 1848 by Koleton Duchemin, RN Outcome: Progressing   Problem: Safety: Goal: Ability to remain free from injury will improve 06/02/2021 1848 by Ronae Noell, RN Outcome: Progressing 06/02/2021 1848 by Exzavier Ruderman, RN Outcome: Progressing   Problem: Skin Integrity: Goal: Risk for impaired skin integrity will decrease 06/02/2021 1848 by Konner Warrior, RN Outcome: Progressing 06/02/2021 1848 by Remonia Otte, RN Outcome: Progressing   

## 2021-06-02 NOTE — Progress Notes (Signed)
SUBJECTIVE: Patient overall feels well today with no evidence of significant congestive heart failure or anginal symptoms.  Overall EKG and telemetry continues to show complete heart block despite   beta-blocker washout.  Currently it appears that the patient may need pacemaker placement for treatment.  We have discussed at length the possibility of pacemaker placement although somewhat concerned about Eliquis and bleeding complications as well.  Patient is hemodynamically stable  Echocardiogram showing normal LV systolic function with ejection fraction of 60% and no evidence of significant valvular heart disease  Vitals:   06/01/21 1734 06/01/21 2026 06/02/21 0408 06/02/21 0600  BP: (!) 166/45 (!) 168/45 (!) 147/41   Pulse: (!) 40 (!) 36 (!) 33   Resp:  16 15   Temp: 98.1 F (36.7 C) 98.4 F (36.9 C) 98.6 F (37 C)   TempSrc: Oral Oral    SpO2:  95% 94%   Weight:    62.9 kg  Height:        Intake/Output Summary (Last 24 hours) at 06/02/2021 0640 Last data filed at 06/01/2021 2027 Gross per 24 hour  Intake --  Output 1300 ml  Net -1300 ml     LABS: Basic Metabolic Panel: Recent Labs    05/30/21 1537 05/30/21 2333  NA 136  --   K 3.7 3.8  CL 101  --   CO2 26  --   GLUCOSE 126*  --   BUN 26*  --   CREATININE 1.00  --   CALCIUM 9.9  --   MG  --  2.1  PHOS  --  3.6    Liver Function Tests: No results for input(s): AST, ALT, ALKPHOS, BILITOT, PROT, ALBUMIN in the last 72 hours. No results for input(s): LIPASE, AMYLASE in the last 72 hours. CBC: Recent Labs    05/30/21 1537 05/31/21 0622  WBC 8.7 7.7  NEUTROABS 5.3  --   HGB 14.2 12.8  HCT 43.2 38.4  MCV 90.4 89.7  PLT 290 249    Cardiac Enzymes: No results for input(s): CKTOTAL, CKMB, CKMBINDEX, TROPONINI in the last 72 hours. BNP: Invalid input(s): POCBNP D-Dimer: No results for input(s): DDIMER in the last 72 hours. Hemoglobin A1C: No results for input(s): HGBA1C in the last 72 hours. Fasting Lipid  Panel: No results for input(s): CHOL, HDL, LDLCALC, TRIG, CHOLHDL, LDLDIRECT in the last 72 hours. Thyroid Function Tests: Recent Labs    05/30/21 1537  TSH 1.404    Anemia Panel: Recent Labs    06/02/21 0413  FERRITIN 61      PHYSICAL EXAM General: Well developed, well nourished, in no acute distress HEENT:  Normocephalic and atramatic Neck:  No JVD.  Lungs: Clear bilaterally to auscultation and percussion. Heart: HRRR . Normal S1 and S2 without gallops or murmurs.  Abdomen: Bowel sounds are positive, abdomen soft and non-tender  Msk:  Back normal, normal gait. Normal strength and tone for age. Extremities: No clubbing, cyanosis or edema.   Neuro: Alert and oriented X 3. Psych:  Good affect, responds appropriately  TELEMETRY: Reviewed telemetry pt in complete heart block with ventricular escape of 28 bpm:  ASSESSMENT AND PLAN: 64 year old female with cerebrovascular concerns and hypertension now with complete heart block causing weakness fatigue and dizziness and presyncope not improved at this time despite washout of beta-blocker needing further intervention including Micra pacemaker placement 1.  Continue observation at this time and further discussion of the possibility of Micra pacemaker placement.  Patient understands the risk and  benefits of Micra pacemaker placement this includes in the possibility of death stroke heart attack Bleeding bruising rhythm disturbances and hemopericardium.  The patient is at low risk for conscious sedation 2.  Continue to abstain from Eliquis and beta-blocker for further risk reduction and side effects of above issues. 3.  Plan for Micra pacemaker placement as long as patient has no further significant symptoms at this time for Monday.  Further discussion of the possibility of a temporary pacemaker as well if necessary although currently still hemodynamically stable with no particular need Principal Problem:   Dizziness, nonspecific Active  Problems:   Controlled type 2 diabetes mellitus without complication, without long-term current use of insulin (HCC)   Essential hypertension   Heart block AV third degree (HCC)   Complete heart block (HCC)    Lamar Blinks, MD, [facility] 06/02/2021 6:40 AM

## 2021-06-03 ENCOUNTER — Other Ambulatory Visit: Payer: Self-pay

## 2021-06-03 ENCOUNTER — Encounter
Admission: EM | Disposition: A | Discharge status: Home / Self Care | Payer: Self-pay | Source: Home / Self Care | Attending: Internal Medicine

## 2021-06-03 HISTORY — PX: PACEMAKER LEADLESS INSERTION: EP1219

## 2021-06-03 LAB — GLUCOSE, CAPILLARY
Glucose-Capillary: 119 mg/dL — ABNORMAL HIGH (ref 70–99)
Glucose-Capillary: 175 mg/dL — ABNORMAL HIGH (ref 70–99)

## 2021-06-03 LAB — SURGICAL PCR SCREEN
MRSA, PCR: NEGATIVE
Staphylococcus aureus: NEGATIVE

## 2021-06-03 LAB — FERRITIN: Ferritin: 63 ng/mL (ref 11–307)

## 2021-06-03 LAB — LYME DISEASE DNA BY PCR(BORRELIA BURG): Lyme Disease(B.burgdorferi)PCR: NEGATIVE

## 2021-06-03 SURGERY — PACEMAKER LEADLESS INSERTION
Anesthesia: Moderate Sedation

## 2021-06-03 MED ORDER — LIDOCAINE HCL (PF) 1 % IJ SOLN
INTRAMUSCULAR | Status: DC | PRN
  Administered 2021-06-03: 30 mL

## 2021-06-03 MED ORDER — SODIUM CHLORIDE 0.9 % IV SOLN: 250.0000 mL | INTRAVENOUS | Status: DC | PRN

## 2021-06-03 MED ORDER — ONDANSETRON HCL 4 MG/2ML IJ SOLN: 4.0000 mg | Freq: Four times a day (QID) | INTRAMUSCULAR | Status: DC | PRN

## 2021-06-03 MED ORDER — LIDOCAINE HCL (PF) 1 % IJ SOLN
INTRAMUSCULAR | Status: AC
  Filled 2021-06-03: qty 30

## 2021-06-03 MED ORDER — FENTANYL CITRATE (PF) 100 MCG/2ML IJ SOLN
INTRAMUSCULAR | Status: AC
  Filled 2021-06-03: qty 2

## 2021-06-03 MED ORDER — HEPARIN (PORCINE) IN NACL 1000-0.9 UT/500ML-% IV SOLN
INTRAVENOUS | Status: AC
  Filled 2021-06-03: qty 1000

## 2021-06-03 MED ORDER — SODIUM CHLORIDE 0.45 % IV SOLN: INTRAVENOUS | Status: DC

## 2021-06-03 MED ORDER — MIDAZOLAM HCL 2 MG/2ML IJ SOLN
INTRAMUSCULAR | Status: DC | PRN
  Administered 2021-06-03: 0.5 mg via INTRAVENOUS
  Administered 2021-06-03: 1 mg via INTRAVENOUS

## 2021-06-03 MED ORDER — HEPARIN (PORCINE) IN NACL 1000-0.9 UT/500ML-% IV SOLN
INTRAVENOUS | Status: DC | PRN
  Administered 2021-06-03: 1000 mL

## 2021-06-03 MED ORDER — SODIUM CHLORIDE 0.9% FLUSH: 3.0000 mL | INTRAVENOUS | Status: DC | PRN

## 2021-06-03 MED ORDER — HEPARIN SODIUM (PORCINE) 1000 UNIT/ML IJ SOLN
INTRAMUSCULAR | Status: AC
  Filled 2021-06-03: qty 1

## 2021-06-03 MED ORDER — SODIUM CHLORIDE 0.9 % IV SOLN: INTRAVENOUS | Status: DC

## 2021-06-03 MED ORDER — FENTANYL CITRATE (PF) 100 MCG/2ML IJ SOLN
INTRAMUSCULAR | Status: DC | PRN
  Administered 2021-06-03 (×2): 25 ug via INTRAVENOUS

## 2021-06-03 MED ORDER — HEPARIN SODIUM (PORCINE) 1000 UNIT/ML IJ SOLN
INTRAMUSCULAR | Status: DC | PRN
  Administered 2021-06-03: 3500 [IU] via INTRAVENOUS

## 2021-06-03 MED ORDER — SODIUM CHLORIDE 0.9% FLUSH
3.0000 mL | Freq: Two times a day (BID) | INTRAVENOUS | Status: DC
  Administered 2021-06-03 – 2021-06-04 (×2): 3 mL via INTRAVENOUS

## 2021-06-03 MED ORDER — IOHEXOL 300 MG/ML  SOLN
INTRAMUSCULAR | Status: DC | PRN
  Administered 2021-06-03: 10 mL

## 2021-06-03 MED ORDER — MIDAZOLAM HCL 2 MG/2ML IJ SOLN
INTRAMUSCULAR | Status: AC
  Filled 2021-06-03: qty 2

## 2021-06-03 SURGICAL SUPPLY — 18 items
CABLE ADAPT PACING TEMP 12FT (ADAPTER) ×3 IMPLANT
DILATOR VESSEL 38 20CM 12FR (INTRODUCER) ×3 IMPLANT
DILATOR VESSEL 38 20CM 14FR (INTRODUCER) ×3 IMPLANT
DILATOR VESSEL 38 20CM 18FR (INTRODUCER) ×3 IMPLANT
DILATOR VESSEL 38 20CM 8FR (INTRODUCER) ×3 IMPLANT
DRAPE BRACHIAL (DRAPES) ×3 IMPLANT
MICRA AV TRANSCATH PACING SYS (Pacemaker) ×3 IMPLANT
MICRA INTRODUCER SHEATH (SHEATH) ×3
NEEDLE PERC 18GX7CM (NEEDLE) ×3 IMPLANT
PACK CARDIAC CATH (CUSTOM PROCEDURE TRAY) ×3 IMPLANT
PAD ELECT DEFIB RADIOL ZOLL (MISCELLANEOUS) ×3 IMPLANT
SHEATH AVANTI 6FR X 11CM (SHEATH) ×3 IMPLANT
SHEATH AVANTI 7FRX11 (SHEATH) ×3 IMPLANT
SHEATH INTRODUCER MICRA (SHEATH) ×1 IMPLANT
SLEEVE REPOSITIONING LENGTH 30 (MISCELLANEOUS) ×3 IMPLANT
SYSTEM PACING TRNSCTH AV MICRA (Pacemaker) ×1 IMPLANT
WIRE AMPLATZ SS-J .035X180CM (WIRE) ×3 IMPLANT
WIRE PACING TEMP ST TIP 5 (CATHETERS) ×3 IMPLANT

## 2021-06-03 NOTE — Progress Notes (Signed)
SUBJECTIVE: Patient overall feels well today with no evidence of significant congestive heart failure or anginal symptoms.  Overall EKG and telemetry continues to show complete heart block despite   beta-blocker washout.  Currently it appears that the patient may need pacemaker placement for treatment.  We have discussed at length the possibility of pacemaker placement although somewhat concerned about Eliquis and bleeding complications as well.  Patient is hemodynamically stable  Echocardiogram showing normal LV systolic function with ejection fraction of 60% and no evidence of significant valvular heart disease  Vitals:   06/02/21 1139 06/02/21 1557 06/02/21 2052 06/03/21 0500  BP: (!) 163/45 (!) 153/43 (!) 161/58 (!) 157/54  Pulse: (!) 36 (!) 37 (!) 41 (!) 34  Resp: 18 18 18 18   Temp: 97.8 F (36.6 C) (!) 97.3 F (36.3 C) (!) 97.5 F (36.4 C) 97.7 F (36.5 C)  TempSrc: Oral Oral Oral Oral  SpO2: 93% 97% 95% 96%  Weight:      Height:        Intake/Output Summary (Last 24 hours) at 06/03/2021 0836 Last data filed at 06/03/2021 0500 Gross per 24 hour  Intake 240 ml  Output 2075 ml  Net -1835 ml     LABS: Basic Metabolic Panel: No results for input(s): NA, K, CL, CO2, GLUCOSE, BUN, CREATININE, CALCIUM, MG, PHOS in the last 72 hours.  Liver Function Tests: No results for input(s): AST, ALT, ALKPHOS, BILITOT, PROT, ALBUMIN in the last 72 hours. No results for input(s): LIPASE, AMYLASE in the last 72 hours. CBC: No results for input(s): WBC, NEUTROABS, HGB, HCT, MCV, PLT in the last 72 hours.  Cardiac Enzymes: No results for input(s): CKTOTAL, CKMB, CKMBINDEX, TROPONINI in the last 72 hours. BNP: Invalid input(s): POCBNP D-Dimer: No results for input(s): DDIMER in the last 72 hours. Hemoglobin A1C: No results for input(s): HGBA1C in the last 72 hours. Fasting Lipid Panel: No results for input(s): CHOL, HDL, LDLCALC, TRIG, CHOLHDL, LDLDIRECT in the last 72 hours. Thyroid  Function Tests: No results for input(s): TSH, T4TOTAL, T3FREE, THYROIDAB in the last 72 hours.  Invalid input(s): FREET3  Anemia Panel: Recent Labs    06/03/21 0557  FERRITIN 65      PHYSICAL EXAM General: Well developed, well nourished, in no acute distress HEENT:  Normocephalic and atramatic Neck:  No JVD.  Lungs: Clear bilaterally to auscultation and percussion. Heart: HRRR . Normal S1 and S2 without gallops or murmurs.  Abdomen: Bowel sounds are positive, abdomen soft and non-tender  Msk:  Back normal, normal gait. Normal strength and tone for age. Extremities: No clubbing, cyanosis or edema.   Neuro: Alert and oriented X 3. Psych:  Good affect, responds appropriately  TELEMETRY: Reviewed telemetry pt in complete heart block with ventricular escape of 28 bpm:  ASSESSMENT AND PLAN: 65 year old female with cerebrovascular concerns and hypertension now with complete heart block causing weakness fatigue and dizziness and presyncope not improved at this time despite washout of beta-blocker needing further intervention including Micra pacemaker placement 1.  Continue observation at this time and further discussion of the possibility of Micra pacemaker placement.  Patient understands the risk and benefits of Micra pacemaker placement this includes in the possibility of death stroke heart attack Bleeding bruising rhythm disturbances and hemopericardium.  The patient is at low risk for conscious sedation 2.  Continue to abstain from Eliquis and beta-blocker for further risk reduction and side effects of above issues. 3.  Plan for Micra pacemaker placement as long as  patient has no further significant symptoms at this time for today further discussion of the possibility of a temporary pacemaker as well if necessary although currently still hemodynamically stable with no particular need Principal Problem:   Dizziness, nonspecific Active Problems:   Controlled type 2 diabetes mellitus  without complication, without long-term current use of insulin (HCC)   Essential hypertension   Heart block AV third degree (HCC)   Complete heart block (HCC)    Lamar Blinks, MD, [facility] 06/03/2021 8:36 AM

## 2021-06-03 NOTE — Progress Notes (Addendum)
[facility]   PATIENT NAME: Sherri Jenkins    MR#:  [id number]  DATE OF BIRTH:  [birth year]  SUBJECTIVE:  CHIEF COMPLAINT:   Chief Complaint  Patient presents with   Bradycardia  Reports no new problem.  Waiting for pacemaker placement REVIEW OF SYSTEMS:  Review of Systems  Constitutional:  Negative for diaphoresis, fever, malaise/fatigue and weight loss.  HENT:  Negative for ear discharge, ear pain, hearing loss, nosebleeds, sore throat and tinnitus.   Eyes:  Negative for blurred vision and pain.  Respiratory:  Positive for cough. Negative for hemoptysis, shortness of breath and wheezing.   Cardiovascular:  Negative for chest pain, palpitations, orthopnea and leg swelling.  Gastrointestinal:  Negative for abdominal pain, blood in stool, constipation, diarrhea, heartburn, nausea and vomiting.  Genitourinary:  Negative for dysuria, frequency and urgency.  Musculoskeletal:  Negative for back pain and myalgias.  Skin:  Negative for itching and rash.  Neurological:  Negative for tingling, tremors, focal weakness, seizures, weakness and headaches.  Psychiatric/Behavioral:  Negative for depression. The patient is not nervous/anxious.   DRUG ALLERGIES:   Allergies  Allergen Reactions   Wheat Bran Diarrhea   Atorvastatin    Corn-Containing Products Diarrhea   Penicillins Hives   Sulfa Antibiotics Rash   VITALS:  Blood pressure (!) 188/60, pulse (!) 36, temperature 97.9 F (36.6 C), resp. rate 18, height 5' (1.524 m), weight 62.9 kg, SpO2 97 %. PHYSICAL EXAMINATION:  Physical Exam 65 year old female lying in the bed comfortably without any acute distress Lungs clear to auscultation bilaterally no wheezing rales rhonchi or crepitation Cardiovascular S1-S2 normal bradycardic no murmur rales or gallop Abdomen soft benign Neuro alert and oriented nonfocal exam Skin no rash or lesion Psych normal mood and affect LABORATORY PANEL:  Female CBC Recent  Labs  Lab 05/31/21 0622  WBC 7.7  HGB 12.8  HCT 38.4  PLT 249    ------------------------------------------------------------------------------------------------------------------ Chemistries  Recent Labs  Lab 05/30/21 1537 05/30/21 2333  NA 136  --   K 3.7 3.8  CL 101  --   CO2 26  --   GLUCOSE 126*  --   BUN 26*  --   CREATININE 1.00  --   CALCIUM 9.9  --   MG  --  2.1    RADIOLOGY:  No results found. ASSESSMENT AND PLAN:  65 year old female with a known history of hypertension, hyperlipidemia, PVD, diabetes and history of stroke is admitted for complete heart block with symptoms of fatigue and dizziness  Complete heart block cardiology Dr. Arnoldo Hooker following Avoid any rate controlling agents.  No Eliquis. Plan for permanent pacemaker today Patient's resting heart rate is in 20s to 30s but she is asymptomatic  Echo shows normal LV systolic function  Diabetes mellitus Sliding scale insulin for now  Hyperlipidemia On Crestor  Cough/congestion Robitussin as needed   Body mass index is 27.08 kg/m.  Net IO Since Admission: -3,047.74 mL [06/03/21 1437]      Status is: Inpatient  Remains inpatient appropriate because:Ongoing diagnostic testing needed not appropriate for outpatient work up  Dispo: The patient is from: Home              Anticipated d/c is to: Home              Patient currently is not medically stable to d/c.   Difficult to place patient No     DVT prophylaxis:  SCDs Start: 05/30/21 1838     Family Communication: Updated husband at bedside on 6/10. Left message for husband on 6/13   All the records are reviewed and case discussed with Care Management/Social Worker. Management plans discussed with the patient, nursing and they are in agreement.  CODE STATUS: Full Code Level of care: Progressive Cardiac  TOTAL TIME TAKING CARE OF THIS PATIENT: 35 minutes.   More than 50% of the time was spent in  counseling/coordination of care: YES  POSSIBLE D/C IN 1 DAYS, DEPENDING ON CLINICAL CONDITION.  Will need overnight observation after pacemaker per cardiology   Delfino Lovett M.D on 06/03/2021 at 2:37 PM  [facility]   CC: Primary care physician; Laurine Blazer, MD  Note: This dictation was prepared with Dragon dictation along with smaller phrase technology. Any transcriptional errors that result from this process are unintentional.

## 2021-06-04 ENCOUNTER — Encounter: Payer: Self-pay | Admitting: Cardiology

## 2021-06-04 LAB — MISC LABCORP TEST (SEND OUT): Labcorp test code: 164226

## 2021-06-04 LAB — BASIC METABOLIC PANEL
Anion gap: 11 (ref 5–15)
BUN: 15 mg/dL (ref 8–23)
CO2: 24 mmol/L (ref 22–32)
Calcium: 9.9 mg/dL (ref 8.9–10.3)
Chloride: 103 mmol/L (ref 98–111)
Creatinine, Ser: 0.68 mg/dL (ref 0.44–1.00)
GFR, Estimated: >60 mL/min (ref >60)
Glucose, Bld: 142 mg/dL — ABNORMAL HIGH (ref 70–99)
Potassium: 4.8 mmol/L (ref 3.5–5.1)
Sodium: 138 mmol/L (ref 135–145)

## 2021-06-04 LAB — CBC
HCT: 46.2 % — ABNORMAL HIGH (ref 36.0–46.0)
Hemoglobin: 15.2 g/dL — ABNORMAL HIGH (ref 12.0–15.0)
MCH: 29.3 pg (ref 26.0–34.0)
MCHC: 32.9 g/dL (ref 30.0–36.0)
MCV: 89.2 fL (ref 80.0–100.0)
Platelets: 283 10*3/uL (ref 150–400)
RBC: 5.18 MIL/uL — ABNORMAL HIGH (ref 3.87–5.11)
RDW: 13.2 % (ref 11.5–15.5)
WBC: 10 10*3/uL (ref 4.0–10.5)
nRBC: 0 % (ref 0.0–0.2)

## 2021-06-04 LAB — B. BURGDORFI ANTIBODIES

## 2021-06-04 LAB — FERRITIN: Ferritin: 56 ng/mL (ref 11–307)

## 2021-06-04 MED ORDER — ELIQUIS 5 MG PO TABS: 5.0000 mg | ORAL_TABLET | Freq: Two times a day (BID) | ORAL | Status: AC

## 2021-06-04 NOTE — Plan of Care (Signed)
  Problem: Health Behavior/Discharge Planning: Goal: Ability to manage health-related needs will improve Outcome: Progressing   Problem: Clinical Measurements: Goal: Ability to maintain clinical measurements within normal limits will improve Outcome: Progressing   Problem: Safety: Goal: Ability to remain free from injury will improve Outcome: Progressing   

## 2021-06-04 NOTE — Care Management Important Message (Signed)
Important Message  Patient Details  Name: Sherri Jenkins MRN: [id number] Date of Birth: 1956-12-22   Medicare Important Message Given:  Yes     [name] 06/04/2021, 11:42 AM

## 2021-06-04 NOTE — Discharge Summary (Signed)
Physician Discharge Summary  Sherri Jenkins YQM:[id number] DOB: 09/21/1956 DOA: 05/30/2021  PCP: Laurine Blazer, MD  Admit date: 05/30/2021 Discharge date: 06/04/2021  Admitted From: Home Disposition: Home  Recommendations for Outpatient Follow-up:  Follow up with PCP in 1-2 weeks Follow-up cardiology 1 week  Home Health: No Equipment/Devices: None  Discharge Condition: Stable CODE STATUS: Full Diet recommendation: Heart Healthy  Brief/Interim Summary: 65 year old female with a known history of hypertension, hyperlipidemia, PVD, diabetes and history of stroke is admitted for complete heart block with symptoms of fatigue and dizziness. Initial plan per cardiology was to hold beta-blocker and monitor patient after washout.  Symptoms remained after several days and patient underwent Micra pacer placement.  Did well with procedure and was evaluated on postprocedure day 1.  Cleared for discharge from cardiology standpoint.  Will remain off Eliquis for 3 days and remain off beta-blocker indefinitely until seen by cardiology office in 1 week.   Discharge Diagnoses:  Principal Problem:   Dizziness, nonspecific Active Problems:   Controlled type 2 diabetes mellitus without complication, without long-term current use of insulin (HCC)   Essential hypertension   Heart block AV third degree (HCC)   Complete heart block (HCC)  Underwent placement of Micra pacemaker.  Monitored overnight and cleared for discharge the following morning by cardiology.  Stay off Eliquis for 3 days.  Stay off beta-blocker until seen by cardiology in the office.  Discharge Instructions  Discharge Instructions     Diet - low sodium heart healthy   Complete by: As directed    Increase activity slowly   Complete by: As directed       Allergies as of 06/04/2021       Reactions   Wheat Bran Diarrhea   Atorvastatin    Corn-containing Products Diarrhea   Penicillins Hives   Sulfa Antibiotics Rash         Medication List     STOP taking these medications    metoprolol succinate 50 MG 24 hr tablet Commonly known as: TOPROL-XL       TAKE these medications    aspirin EC 81 MG tablet Take 81 mg by mouth daily. Swallow whole.   donepezil 10 MG tablet Commonly known as: ARICEPT Take 10 mg by mouth at bedtime.   Eliquis 5 MG Tabs tablet Generic drug: apixaban Take 1 tablet (5 mg total) by mouth 2 (two) times daily. Start taking on: June 06, 2021 What changed: These instructions start on June 06, 2021. If you are unsure what to do until then, ask your doctor or other care provider.   hydrochlorothiazide 25 MG tablet Commonly known as: HYDRODIURIL Take 25 mg by mouth daily.   losartan 25 MG tablet Commonly known as: COZAAR Take 25 mg by mouth daily.   metFORMIN 500 MG tablet Commonly known as: GLUCOPHAGE Take by mouth 2 (two) times daily with a meal.   PARoxetine 40 MG tablet Commonly known as: PAXIL Take 40 mg by mouth every morning.   rosuvastatin 5 MG tablet Commonly known as: CRESTOR Take 2.5 mg by mouth 3 (three) times a week. Monday, Wednesday and Friday        Follow-up Information     Lamar Blinks, MD. Go on 06/12/2021.   Specialty: Cardiology Why: Appointment at 3pm Contact information: [address] [facility] [place] [address] [phone]         Laurine Blazer, MD. Schedule an appointment as soon as possible for a visit on 06/13/2021.  Specialty: Family Medicine Why: Appointment at 2pm Contact information: [address] [facility] [address] [place] [address] [phone]                Allergies  Allergen Reactions   Wheat Bran Diarrhea   Atorvastatin    Corn-Containing Products Diarrhea   Penicillins Hives   Sulfa Antibiotics Rash    Consultations: Cardiology-[facility]   Procedures/Studies: EP PPM/ICD IMPLANT  Result Date: 06/03/2021 Successful Medtronic Micra AV leadless  pacemaker implantation  ECHOCARDIOGRAM COMPLETE  Result Date: 05/31/2021    ECHOCARDIOGRAM REPORT   Patient Name:   Sherri Jenkins Date of Exam: 05/31/2021 Medical Rec #:  [id number]    Height:       60.0 in Accession #:    [id number]   Weight:       145.0 lb Date of Birth:  [birth year]    BSA:          1.628 m Patient Age:    65 years     BP:           162/58 mmHg Patient Gender: F            HR:           25 bpm. Exam Location:  [facility] Procedure: 2D Echo, Color Doppler and Cardiac Doppler Indications:     Bradycardia; Heart block AV third degree  History:         Patient has no prior history of Echocardiogram examinations.                  Stroke; Risk Factors:Hypertension and Diabetes.  Sonographer:     Humphrey Rolls RDCS (AE) Referring Phys:  [id number] Gertha Calkin Diagnosing Phys: Arnoldo Hooker MD  Sonographer Comments: Suboptimal apical window and suboptimal subcostal window. IMPRESSIONS  1. Left ventricular ejection fraction, by estimation, is 55 to 60%. The left ventricle has normal function. The left ventricle has no regional wall motion abnormalities. Left ventricular diastolic parameters were normal.  2. Right ventricular systolic function is normal. The right ventricular size is normal.  3. The mitral valve is normal in structure. Mild mitral valve regurgitation.  4. The aortic valve is normal in structure. Aortic valve regurgitation is not visualized. FINDINGS  Left Ventricle: Left ventricular ejection fraction, by estimation, is 55 to 60%. The left ventricle has normal function. The left ventricle has no regional wall motion abnormalities. The left ventricular internal cavity size was normal in size. There is  no left ventricular hypertrophy. Left ventricular diastolic parameters were normal. Right Ventricle: The right ventricular size is normal. No increase in right ventricular wall thickness. Right ventricular systolic function is normal. Left Atrium: Left atrial size was normal in size. Right Atrium: Right  atrial size was normal in size. Pericardium: There is no evidence of pericardial effusion. Mitral Valve: The mitral valve is normal in structure. Mild mitral valve regurgitation. MV peak gradient, 5.0 mmHg. The mean mitral valve gradient is 3.0 mmHg. Tricuspid Valve: The tricuspid valve is normal in structure. Tricuspid valve regurgitation is trivial. Aortic Valve: The aortic valve is normal in structure. Aortic valve regurgitation is not visualized. Aortic valve mean gradient measures 5.0 mmHg. Aortic valve peak gradient measures 10.9 mmHg. Aortic valve area, by VTI measures 1.06 cm. Pulmonic Valve: The pulmonic valve was normal in structure. Pulmonic valve regurgitation is not visualized. Aorta: The aortic root and ascending aorta are structurally normal, with no evidence of dilitation. IAS/Shunts: No atrial level shunt detected by  color flow Doppler.  LEFT VENTRICLE PLAX 2D LVIDd:         4.60 cm LVIDs:         3.00 cm LV PW:         1.10 cm LV IVS:        0.70 cm LVOT diam:     1.50 cm LV SV:         46 LV SV Index:   29 LVOT Area:     1.77 cm  RIGHT VENTRICLE RV Basal diam:  3.10 cm LEFT ATRIUM         Index LA diam:    3.90 cm 2.40 cm/m  AORTIC VALVE                    PULMONIC VALVE AV Area (Vmax):    1.11 cm     PV Vmax:       1.51 m/s AV Area (Vmean):   1.21 cm     PV Vmean:      94.400 cm/s AV Area (VTI):     1.06 cm     PV VTI:        0.297 m AV Vmax:           165.00 cm/s  PV Peak grad:  9.1 mmHg AV Vmean:          101.000 cm/s PV Mean grad:  4.0 mmHg AV VTI:            0.440 m AV Peak Grad:      10.9 mmHg AV Mean Grad:      5.0 mmHg LVOT Vmax:         104.00 cm/s LVOT Vmean:        69.100 cm/s LVOT VTI:          0.263 m LVOT/AV VTI ratio: 0.60  AORTA Ao Root diam: 2.60 cm MITRAL VALVE MV Area (PHT): 4.97 cm     SHUNTS MV Area VTI:   1.35 cm     Systemic VTI:  0.26 m MV Peak grad:  5.0 mmHg     Systemic Diam: 1.50 cm MV Mean grad:  3.0 mmHg MV Vmax:       1.12 m/s MV Vmean:      84.2 cm/s MV  Decel Time: 153 msec MV E velocity: 133.00 cm/s MV A velocity: 97.85 cm/s MV E/A ratio:  1.36 Arnoldo Hooker MD Electronically signed by Arnoldo Hooker MD Signature Date/Time: 05/31/2021/3:31:43 PM    Final    (Echo, Carotid, EGD, Colonoscopy, ERCP)    Subjective: Patient seen and examined on the day of discharge.  Stable no distress.  Understands postdischarge instructions.  Stable for discharge home.  Discharge Exam: Vitals:   06/04/21 0757 06/04/21 1122  BP: (!) 163/68 (!) 157/81  Pulse: 98 99  Resp: 18 17  Temp: 97.7 F (36.5 C) 97.9 F (36.6 C)  SpO2: 94% 97%   Vitals:   06/03/21 2155 06/04/21 0455 06/04/21 0757 06/04/21 1122  BP: (!) 161/69 (!) 163/60 (!) 163/68 (!) 157/81  Pulse:  89 98 99  Resp: 18 17 18 17   Temp: 97.6 F (36.4 C) 98.4 F (36.9 C) 97.7 F (36.5 C) 97.9 F (36.6 C)  TempSrc: Oral     SpO2: 97% 95% 94% 97%  Weight:  65.8 kg    Height:        General: Pt is alert, awake, not in acute distress Cardiovascular: RRR, S1/S2 +, no rubs,  no gallops Respiratory: CTA bilaterally, no wheezing, no rhonchi Abdominal: Soft, NT, ND, bowel sounds + Extremities: no edema, no cyanosis    The results of significant diagnostics from this hospitalization (including imaging, microbiology, ancillary and laboratory) are listed below for reference.     Microbiology: Recent Results (from the past 240 hour(s))  Resp Panel by RT-PCR (Flu A&B, Covid) Nasopharyngeal Swab     Status: None   Collection Time: 05/30/21  3:37 PM   Specimen: Nasopharyngeal Swab; Nasopharyngeal(NP) swabs in vial transport medium  Result Value Ref Range Status   SARS Coronavirus 2 by RT PCR NEGATIVE NEGATIVE Final    Comment: (NOTE) SARS-CoV-2 target nucleic acids are NOT DETECTED.  The SARS-CoV-2 RNA is generally detectable in upper respiratory specimens during the acute phase of infection. The lowest concentration of SARS-CoV-2 viral copies this assay can detect is 138 copies/mL. A  negative result does not preclude SARS-Cov-2 infection and should not be used as the sole basis for treatment or other patient management decisions. A negative result may occur with  improper specimen collection/handling, submission of specimen other than nasopharyngeal swab, presence of viral mutation(s) within the areas targeted by this assay, and inadequate number of viral copies(<138 copies/mL). A negative result must be combined with clinical observations, patient history, and epidemiological information. The expected result is Negative.  Fact Sheet for Patients:  BloggerCourse.comhttps://www.fda.gov/media/152166/download  Fact Sheet for Healthcare Providers:  SeriousBroker.ithttps://www.fda.gov/media/152162/download  This test is no t yet approved or cleared by the [place] FDA and  has been authorized for detection and/or diagnosis of SARS-CoV-2 by FDA under an Emergency Use Authorization (EUA). This EUA will remain  in effect (meaning this test can be used) for the duration of the COVID-19 declaration under Section 564(b)(1) of the Act, 21 U.S.C.section 360bbb-3(b)(1), unless the authorization is terminated  or revoked sooner.       Influenza A by PCR NEGATIVE NEGATIVE Final   Influenza B by PCR NEGATIVE NEGATIVE Final    Comment: (NOTE) The Xpert Xpress SARS-CoV-2/FLU/RSV plus assay is intended as an aid in the diagnosis of influenza from Nasopharyngeal swab specimens and should not be used as a sole basis for treatment. Nasal washings and aspirates are unacceptable for Xpert Xpress SARS-CoV-2/FLU/RSV testing.  Fact Sheet for Patients: BloggerCourse.comhttps://www.fda.gov/media/152166/download  Fact Sheet for Healthcare Providers: SeriousBroker.ithttps://www.fda.gov/media/152162/download  This test is not yet approved or cleared by the [place] FDA and has been authorized for detection and/or diagnosis of SARS-CoV-2 by FDA under an Emergency Use Authorization (EUA). This EUA will remain in effect (meaning this test can  be used) for the duration of the COVID-19 declaration under Section 564(b)(1) of the Act, 21 U.S.C. section 360bbb-3(b)(1), unless the authorization is terminated or revoked.  Performed at [facility], [address] [place], [place] [address]   Surgical PCR screen     Status: None   Collection Time: 06/03/21  8:20 AM   Specimen: Nasal Mucosa; Nasal Swab  Result Value Ref Range Status   MRSA, PCR NEGATIVE NEGATIVE Final   Staphylococcus aureus NEGATIVE NEGATIVE Final    Comment: (NOTE) The Xpert SA Assay (FDA approved for NASAL specimens in patients 63 years of age and older), is one component of a comprehensive surveillance program. It is not intended to diagnose infection nor to guide or monitor treatment. Performed at [facility], [address] [place], [place] [address]      Labs: BNP (last 3 results) No results for input(s): BNP in the last 8760  hours. Basic Metabolic Panel: Recent Labs  Lab 05/30/21 1537 05/30/21 2333 06/04/21 0734  NA 136  --  138  K 3.7 3.8 4.8  CL 101  --  103  CO2 26  --  24  GLUCOSE 126*  --  142*  BUN 26*  --  15  CREATININE 1.00  --  0.68  CALCIUM 9.9  --  9.9  MG  --  2.1  --   PHOS  --  3.6  --    Liver Function Tests: No results for input(s): AST, ALT, ALKPHOS, BILITOT, PROT, ALBUMIN in the last 168 hours. No results for input(s): LIPASE, AMYLASE in the last 168 hours. No results for input(s): AMMONIA in the last 168 hours. CBC: Recent Labs  Lab 05/30/21 1537 05/31/21 0622 06/04/21 0734  WBC 8.7 7.7 10.0  NEUTROABS 5.3  --   --   HGB 14.2 12.8 15.2*  HCT 43.2 38.4 46.2*  MCV 90.4 89.7 89.2  PLT 290 249 283   Cardiac Enzymes: No results for input(s): CKTOTAL, CKMB, CKMBINDEX, TROPONINI in the last 168 hours. BNP: Invalid input(s): POCBNP CBG: Recent Labs  Lab 06/03/21 1324 06/03/21 2141  GLUCAP 119* 175*   D-Dimer No results for input(s): DDIMER in the last 72 hours. Hgb A1c No results  for input(s): HGBA1C in the last 72 hours. Lipid Profile No results for input(s): CHOL, HDL, LDLCALC, TRIG, CHOLHDL, LDLDIRECT in the last 72 hours. Thyroid function studies No results for input(s): TSH, T4TOTAL, T3FREE, THYROIDAB in the last 72 hours.  Invalid input(s): FREET3 Anemia work up Recent Labs    06/03/21 0557 06/04/21 0734  FERRITIN 63 56   Urinalysis    Component Value Date/Time   COLORURINE YELLOW (A) 03/30/2017 1449   APPEARANCEUR HAZY (A) 03/30/2017 1449   LABSPEC 1.025 03/30/2017 1449   PHURINE 5.0 03/30/2017 1449   GLUCOSEU NEGATIVE 03/30/2017 1449   HGBUR NEGATIVE 03/30/2017 1449   BILIRUBINUR NEGATIVE 03/30/2017 1449   KETONESUR 5 (A) 03/30/2017 1449   PROTEINUR NEGATIVE 03/30/2017 1449   NITRITE NEGATIVE 03/30/2017 1449   LEUKOCYTESUR NEGATIVE 03/30/2017 1449   Sepsis Labs Invalid input(s): PROCALCITONIN,  WBC,  LACTICIDVEN Microbiology Recent Results (from the past 240 hour(s))  Resp Panel by RT-PCR (Flu A&B, Covid) Nasopharyngeal Swab     Status: None   Collection Time: 05/30/21  3:37 PM   Specimen: Nasopharyngeal Swab; Nasopharyngeal(NP) swabs in vial transport medium  Result Value Ref Range Status   SARS Coronavirus 2 by RT PCR NEGATIVE NEGATIVE Final    Comment: (NOTE) SARS-CoV-2 target nucleic acids are NOT DETECTED.  The SARS-CoV-2 RNA is generally detectable in upper respiratory specimens during the acute phase of infection. The lowest concentration of SARS-CoV-2 viral copies this assay can detect is 138 copies/mL. A negative result does not preclude SARS-Cov-2 infection and should not be used as the sole basis for treatment or other patient management decisions. A negative result may occur with  improper specimen collection/handling, submission of specimen other than nasopharyngeal swab, presence of viral mutation(s) within the areas targeted by this assay, and inadequate number of viral copies(<138 copies/mL). A negative result must be  combined with clinical observations, patient history, and epidemiological information. The expected result is Negative.  Fact Sheet for Patients:  BloggerCourse.com  Fact Sheet for Healthcare Providers:  SeriousBroker.it  This test is no t yet approved or cleared by the [place] FDA and  has been authorized for detection and/or diagnosis of SARS-CoV-2 by FDA under  an Emergency Use Authorization (EUA). This EUA will remain  in effect (meaning this test can be used) for the duration of the COVID-19 declaration under Section 564(b)(1) of the Act, 21 U.S.C.section 360bbb-3(b)(1), unless the authorization is terminated  or revoked sooner.       Influenza A by PCR NEGATIVE NEGATIVE Final   Influenza B by PCR NEGATIVE NEGATIVE Final    Comment: (NOTE) The Xpert Xpress SARS-CoV-2/FLU/RSV plus assay is intended as an aid in the diagnosis of influenza from Nasopharyngeal swab specimens and should not be used as a sole basis for treatment. Nasal washings and aspirates are unacceptable for Xpert Xpress SARS-CoV-2/FLU/RSV testing.  Fact Sheet for Patients: BloggerCourse.com  Fact Sheet for Healthcare Providers: SeriousBroker.it  This test is not yet approved or cleared by the [place] FDA and has been authorized for detection and/or diagnosis of SARS-CoV-2 by FDA under an Emergency Use Authorization (EUA). This EUA will remain in effect (meaning this test can be used) for the duration of the COVID-19 declaration under Section 564(b)(1) of the Act, 21 U.S.C. section 360bbb-3(b)(1), unless the authorization is terminated or revoked.  Performed at [facility], [address] [place], [place] [address]   Surgical PCR screen     Status: None   Collection Time: 06/03/21  8:20 AM   Specimen: Nasal Mucosa; Nasal Swab  Result Value Ref Range Status   MRSA, PCR NEGATIVE  NEGATIVE Final   Staphylococcus aureus NEGATIVE NEGATIVE Final    Comment: (NOTE) The Xpert SA Assay (FDA approved for NASAL specimens in patients 66 years of age and older), is one component of a comprehensive surveillance program. It is not intended to diagnose infection nor to guide or monitor treatment. Performed at [facility], [address], [place], [place] [address]      Time coordinating discharge: Over 30 minutes  SIGNED:   Tresa Moore, MD  [facility] 06/04/2021, 5:23 PM Pager   If 7PM-7AM, please contact night-coverage

## 2021-06-04 NOTE — Progress Notes (Signed)
   SUBJECTIVE:  doing much better after pacer placement and now good heart rate without complication  Echocardiogram showing normal LV systolic function with ejection fraction of 60% and no evidence of significant valvular heart disease  Vitals:   06/03/21 2139 06/03/21 2155 06/04/21 0455 06/04/21 0757  BP: (!) 180/50 (!) 161/69 (!) 163/60 (!) 163/68  Pulse: 90  89 98  Resp: 16 18 17 18   Temp: 97.8 F (36.6 C) 97.6 F (36.4 C) 98.4 F (36.9 C) 97.7 F (36.5 C)  TempSrc:  Oral    SpO2: 97% 97% 95% 94%  Weight:   65.8 kg   Height:        Intake/Output Summary (Last 24 hours) at 06/04/2021 0901 Last data filed at 06/04/2021 0835 Gross per 24 hour  Intake 603 ml  Output 1000 ml  Net -397 ml     LABS: Basic Metabolic Panel: Recent Labs    06/04/21 0734  NA 138  K 4.8  CL 103  CO2 24  GLUCOSE 142*  BUN 15  CREATININE 0.68  CALCIUM 9.9    Liver Function Tests: No results for input(s): AST, ALT, ALKPHOS, BILITOT, PROT, ALBUMIN in the last 72 hours. No results for input(s): LIPASE, AMYLASE in the last 72 hours. CBC: Recent Labs    06/04/21 0734  WBC 10.0  HGB 15.2*  HCT 46.2*  MCV 89.2  PLT 283    Cardiac Enzymes: No results for input(s): CKTOTAL, CKMB, CKMBINDEX, TROPONINI in the last 72 hours. BNP: Invalid input(s): POCBNP D-Dimer: No results for input(s): DDIMER in the last 72 hours. Hemoglobin A1C: No results for input(s): HGBA1C in the last 72 hours. Fasting Lipid Panel: No results for input(s): CHOL, HDL, LDLCALC, TRIG, CHOLHDL, LDLDIRECT in the last 72 hours. Thyroid Function Tests: No results for input(s): TSH, T4TOTAL, T3FREE, THYROIDAB in the last 72 hours.  Invalid input(s): FREET3  Anemia Panel: Recent Labs    06/04/21 0734  FERRITIN 56      PHYSICAL EXAM General: Well developed, well nourished, in no acute distress HEENT:  Normocephalic and atramatic Neck:  No JVD.  Lungs: Clear bilaterally to auscultation and  percussion. Heart: HRRR . Normal S1 and S2 without gallops or murmurs.  Abdomen: Bowel sounds are positive, abdomen soft and non-tender  Msk:  Back normal, normal gait. Normal strength and tone for age. Extremities: No clubbing, cyanosis or edema.   Neuro: Alert and oriented X 3. Psych:  Good affect, responds appropriately  TELEMETRY: Reviewed telemetry pt in complete heart block with ventricular escape of 28 bpm:  ASSESSMENT AND PLAN: 65 year old female with cerebrovascular concerns and hypertension now with complete heart block causing weakness fatigue and dizziness and presyncope not improved at this time despite washout of beta-blocker sp Micra pacer placemnt 1.  Ambulation and ok for dc to home 2.  Continue to abstain from Eliquis and beta-blocker for further risk reduction and side effects of above issues.with reinstatement of eliquis in 3 days and hold b-blocker until seen in office  Principal Problem:   Dizziness, nonspecific Active Problems:   Controlled type 2 diabetes mellitus without complication, without long-term current use of insulin (HCC)   Essential hypertension   Heart block AV third degree (HCC)   Complete heart block (HCC)    66, MD, [facility] 06/04/2021 9:01 AM

## 2021-06-04 NOTE — Progress Notes (Signed)
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 ECG today shows AS-VP rhythm at a rate of 94 bpm. The patient denies significant tenderness or drainage from the insertion site. The suture was removed; no hematoma observed. Gauze and Tegaderm applied. No drainage, erythema, or warmth visualized. Adhesive appears to have irritated patient's skin. Care instructions discussed and included in discharge instructions. The patient is to follow up with Dr. Gwen Pounds in about 1 week.

## 2021-06-04 NOTE — Discharge Instructions (Signed)
Please avoid showering/submerging yourself in water for the next week. If your bandage gets wet or starts to fall off, replace it with another piece of gauze and the Tegaderm (clear bandage I provided). You should try to keep it covered for a week. You will follow up with Dr. Gwen Pounds in the office in about 1 week. If you have bleeding from the site, lie down and apply firm pressure for 20 minutes, if it continues to bleed, call our office [phone]). Avoid heavy lifting, squatting (other than sitting) and strenuous activity for 1 week. You will get a call from [employer] to set up your pacemaker and the [employer] device. You do not need to bring this device with you to appointments. It is used to monitor your pacemaker from home.

## 2021-06-17 LAB — LYME DISEASE, WESTERN BLOT
IgG P18 Ab.: ABSENT
IgG P23 Ab.: ABSENT
IgG P28 Ab.: ABSENT
IgG P30 Ab.: ABSENT
IgG P39 Ab.: ABSENT
IgG P41 Ab.: ABSENT
IgG P45 Ab.: ABSENT
IgG P58 Ab.: ABSENT
IgG P66 Ab.: ABSENT
IgG P93 Ab.: ABSENT
IgM P23 Ab.: ABSENT
IgM P39 Ab.: ABSENT
IgM P41 Ab.: ABSENT
Lyme IgG Wb: NEGATIVE
Lyme IgM Wb: NEGATIVE

## 2021-07-22 ENCOUNTER — Other Ambulatory Visit: Payer: Self-pay | Admitting: Family Medicine

## 2021-07-22 DIAGNOSIS — Z1231 Encounter for screening mammogram for malignant neoplasm of breast: Secondary | ICD-10-CM

## 2021-09-02 ENCOUNTER — Ambulatory Visit
Admission: RE | Admit: 2021-09-02 | Discharge: 2021-09-02 | Disposition: A | Discharge status: Home / Self Care | Payer: Medicare Other | Source: Ambulatory Visit | Attending: Family Medicine | Admitting: Family Medicine

## 2021-09-02 ENCOUNTER — Other Ambulatory Visit: Payer: Self-pay

## 2021-09-02 DIAGNOSIS — Z1231 Encounter for screening mammogram for malignant neoplasm of breast: Secondary | ICD-10-CM | POA: Insufficient documentation

## 2022-08-26 ENCOUNTER — Other Ambulatory Visit: Payer: Self-pay | Admitting: Family Medicine

## 2022-08-26 DIAGNOSIS — Z1231 Encounter for screening mammogram for malignant neoplasm of breast: Secondary | ICD-10-CM

## 2022-09-17 ENCOUNTER — Ambulatory Visit
Admission: RE | Admit: 2022-09-17 | Discharge: 2022-09-17 | Disposition: A | Discharge status: Home / Self Care | Payer: Medicare Other | Source: Ambulatory Visit | Attending: Family Medicine | Admitting: Family Medicine

## 2022-09-17 DIAGNOSIS — Z1231 Encounter for screening mammogram for malignant neoplasm of breast: Secondary | ICD-10-CM | POA: Diagnosis present

## 2023-08-18 ENCOUNTER — Other Ambulatory Visit: Payer: Self-pay | Admitting: Family Medicine

## 2023-08-18 DIAGNOSIS — Z1231 Encounter for screening mammogram for malignant neoplasm of breast: Secondary | ICD-10-CM

## 2023-10-27 ENCOUNTER — Ambulatory Visit
Admission: RE | Admit: 2023-10-27 | Discharge: 2023-10-27 | Disposition: A | Discharge status: Home / Self Care | Payer: Medicare Other | Source: Ambulatory Visit | Attending: Family Medicine | Admitting: Family Medicine

## 2023-10-27 DIAGNOSIS — Z1231 Encounter for screening mammogram for malignant neoplasm of breast: Secondary | ICD-10-CM | POA: Insufficient documentation

## 2024-11-02 ENCOUNTER — Other Ambulatory Visit: Payer: Self-pay | Admitting: Family Medicine

## 2024-11-02 DIAGNOSIS — Z1231 Encounter for screening mammogram for malignant neoplasm of breast: Secondary | ICD-10-CM

## 2024-11-02 DIAGNOSIS — Z780 Asymptomatic menopausal state: Secondary | ICD-10-CM

## 2025-01-26 ENCOUNTER — Ambulatory Visit
Admission: RE | Admit: 2025-01-26 | Discharge: 2025-01-26 | Disposition: A | Source: Ambulatory Visit | Attending: Family Medicine | Admitting: Family Medicine

## 2025-01-26 DIAGNOSIS — Z780 Asymptomatic menopausal state: Secondary | ICD-10-CM

## 2025-01-26 DIAGNOSIS — Z1231 Encounter for screening mammogram for malignant neoplasm of breast: Secondary | ICD-10-CM
